# Patient Record
Sex: Male | Born: 1995 | Hispanic: Yes | Marital: Single | Smoking: Never smoker
Health system: Southern US, Community
[De-identification: ages and names within clinical notes are randomized; demographics above are authoritative.]

## PROBLEM LIST (undated history)

## (undated) DIAGNOSIS — K219 Gastro-esophageal reflux disease without esophagitis: Secondary | ICD-10-CM

## (undated) DIAGNOSIS — L509 Urticaria, unspecified: Secondary | ICD-10-CM

## (undated) DIAGNOSIS — J069 Acute upper respiratory infection, unspecified: Secondary | ICD-10-CM

## (undated) HISTORY — PX: TYMPANOSTOMY TUBE PLACEMENT: SHX32

## (undated) HISTORY — DX: Urticaria, unspecified: L50.9

## (undated) HISTORY — DX: Acute upper respiratory infection, unspecified: J06.9

## (undated) HISTORY — DX: Gastro-esophageal reflux disease without esophagitis: K21.9

---

## 2014-10-02 HISTORY — PX: HAND SURGERY: SHX662

## 2015-12-07 ENCOUNTER — Emergency Department (HOSPITAL_COMMUNITY): Payer: BLUE CROSS/BLUE SHIELD

## 2015-12-07 ENCOUNTER — Encounter (HOSPITAL_COMMUNITY): Payer: Self-pay | Admitting: *Deleted

## 2015-12-07 ENCOUNTER — Emergency Department (HOSPITAL_COMMUNITY)
Admission: EM | Admit: 2015-12-07 | Discharge: 2015-12-08 | Disposition: A | Discharge status: Home / Self Care | Payer: BLUE CROSS/BLUE SHIELD | Attending: Physician Assistant | Admitting: Physician Assistant

## 2015-12-07 DIAGNOSIS — Y92410 Unspecified street and highway as the place of occurrence of the external cause: Secondary | ICD-10-CM | POA: Diagnosis not present

## 2015-12-07 DIAGNOSIS — S92355A Nondisplaced fracture of fifth metatarsal bone, left foot, initial encounter for closed fracture: Secondary | ICD-10-CM | POA: Insufficient documentation

## 2015-12-07 DIAGNOSIS — S8992XA Unspecified injury of left lower leg, initial encounter: Secondary | ICD-10-CM | POA: Diagnosis not present

## 2015-12-07 DIAGNOSIS — R55 Syncope and collapse: Secondary | ICD-10-CM | POA: Insufficient documentation

## 2015-12-07 DIAGNOSIS — S79912A Unspecified injury of left hip, initial encounter: Secondary | ICD-10-CM | POA: Diagnosis not present

## 2015-12-07 DIAGNOSIS — S99912A Unspecified injury of left ankle, initial encounter: Secondary | ICD-10-CM | POA: Diagnosis not present

## 2015-12-07 DIAGNOSIS — Y9389 Activity, other specified: Secondary | ICD-10-CM | POA: Diagnosis not present

## 2015-12-07 DIAGNOSIS — S62397A Other fracture of fifth metacarpal bone, left hand, initial encounter for closed fracture: Secondary | ICD-10-CM | POA: Diagnosis not present

## 2015-12-07 DIAGNOSIS — Y998 Other external cause status: Secondary | ICD-10-CM | POA: Diagnosis not present

## 2015-12-07 DIAGNOSIS — S99922A Unspecified injury of left foot, initial encounter: Secondary | ICD-10-CM | POA: Diagnosis present

## 2015-12-07 LAB — BASIC METABOLIC PANEL
ANION GAP: 12 (ref 5–15)
BUN: 9 mg/dL (ref 6–20)
CHLORIDE: 100 mmol/L — AB (ref 101–111)
CO2: 28 mmol/L (ref 22–32)
Calcium: 10.1 mg/dL (ref 8.9–10.3)
Creatinine, Ser: 1 mg/dL (ref 0.61–1.24)
GFR calc non Af Amer: >60 mL/min (ref >60)
Glucose, Bld: 106 mg/dL — ABNORMAL HIGH (ref 65–99)
POTASSIUM: 4.2 mmol/L (ref 3.5–5.1)
SODIUM: 140 mmol/L (ref 135–145)

## 2015-12-07 LAB — CBC
HEMATOCRIT: 46.7 % (ref 39.0–52.0)
HEMOGLOBIN: 16.3 g/dL (ref 13.0–17.0)
MCH: 30.4 pg (ref 26.0–34.0)
MCHC: 34.9 g/dL (ref 30.0–36.0)
MCV: 87.1 fL (ref 78.0–100.0)
Platelets: 235 10*3/uL (ref 150–400)
RBC: 5.36 MIL/uL (ref 4.22–5.81)
RDW: 12.8 % (ref 11.5–15.5)
WBC: 13.7 10*3/uL — AB (ref 4.0–10.5)

## 2015-12-07 MED ORDER — HYDROMORPHONE HCL 1 MG/ML IJ SOLN
1.0000 mg | Freq: Once | INTRAMUSCULAR | Status: AC
  Administered 2015-12-07: 1 mg via INTRAVENOUS
  Filled 2015-12-07: qty 1

## 2015-12-07 MED ORDER — HYDROMORPHONE HCL 1 MG/ML IJ SOLN
0.5000 mg | Freq: Once | INTRAMUSCULAR | Status: AC
  Administered 2015-12-07: 0.5 mg via INTRAVENOUS
  Filled 2015-12-07: qty 1

## 2015-12-07 MED ORDER — OXYCODONE-ACETAMINOPHEN 5-325 MG PO TABS: 1.0000 | ORAL_TABLET | Freq: Four times a day (QID) | ORAL | Status: DC | PRN
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
## 2015-12-07 NOTE — ED Notes (Addendum)
Patient transported to CT via stretcher.
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
## 2015-12-07 NOTE — Discharge Instructions (Signed)
Mr. Alda PonderGuiliano Meloni,  Nice meeting you! Please follow-up with the hand surgeon, Dr. Mina MarbleWeingold (call them first thing tomorrow to set up an appointment), and Dr. Roda ShuttersXu for your foot fracture. Return to the emergency department if you develop confusion, lose consciousness, have chest pain, shortness of breath, lose control of your bladder/bowel. Feel better soon!  S. Lane HackerNicole Lizmarie Witters, PA-C

## 2015-12-08 ENCOUNTER — Emergency Department (HOSPITAL_COMMUNITY): Payer: BLUE CROSS/BLUE SHIELD

## 2015-12-08 DIAGNOSIS — S92355A Nondisplaced fracture of fifth metatarsal bone, left foot, initial encounter for closed fracture: Secondary | ICD-10-CM | POA: Diagnosis not present

## 2015-12-08 LAB — URINE MICROSCOPIC-ADD ON: WBC UA: NONE SEEN WBC/hpf (ref 0–5)

## 2015-12-08 LAB — URINALYSIS, ROUTINE W REFLEX MICROSCOPIC
Bilirubin Urine: NEGATIVE
GLUCOSE, UA: NEGATIVE mg/dL
KETONES UR: 15 mg/dL — AB
LEUKOCYTES UA: NEGATIVE
NITRITE: NEGATIVE
PH: 5.5 (ref 5.0–8.0)
Protein, ur: NEGATIVE mg/dL
Specific Gravity, Urine: 1.016 (ref 1.005–1.030)

## 2015-12-08 MED ORDER — IBUPROFEN 800 MG PO TABS
800.0000 mg | ORAL_TABLET | Freq: Once | ORAL | Status: AC
  Administered 2015-12-08: 800 mg via ORAL
  Filled 2015-12-08: qty 1

## 2015-12-08 MED ORDER — IOHEXOL 300 MG/ML  SOLN
100.0000 mL | Freq: Once | INTRAMUSCULAR | Status: AC | PRN
  Administered 2015-12-08: 100 mL via INTRAVENOUS

## 2015-12-08 MED ORDER — OXYCODONE-ACETAMINOPHEN 5-325 MG PO TABS
ORAL_TABLET | ORAL | Status: AC
  Filled 2015-12-08: qty 1

## 2015-12-08 MED ORDER — OXYCODONE-ACETAMINOPHEN 5-325 MG PO TABS
1.0000 | ORAL_TABLET | Freq: Once | ORAL | Status: AC
  Administered 2015-12-08: 1 via ORAL

## 2015-12-08 NOTE — Progress Notes (Signed)
Orthopedic Tech Progress Note Patient Details:  Martin Bolton 12/29/1995 [id number]  Ortho Devices Type of Ortho Device: CAM walker, Ulna gutter splint Ortho Device/Splint Location: lle cam, lue ulna Ortho Device/Splint Interventions: Ordered, Application   [name], Jazzie Trampe J 12/08/2015, 12:32 AM

## 2015-12-08 NOTE — ED Notes (Signed)
Patient transported to CT 
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
## 2015-12-22 ENCOUNTER — Telehealth: Payer: Self-pay | Admitting: Family Medicine

## 2015-12-22 ENCOUNTER — Ambulatory Visit (INDEPENDENT_AMBULATORY_CARE_PROVIDER_SITE_OTHER): Payer: BLUE CROSS/BLUE SHIELD | Admitting: Podiatry

## 2015-12-22 ENCOUNTER — Encounter: Payer: Self-pay | Admitting: Podiatry

## 2015-12-22 ENCOUNTER — Ambulatory Visit: Payer: BLUE CROSS/BLUE SHIELD | Admitting: Family Medicine

## 2015-12-22 ENCOUNTER — Ambulatory Visit (INDEPENDENT_AMBULATORY_CARE_PROVIDER_SITE_OTHER): Payer: BLUE CROSS/BLUE SHIELD

## 2015-12-22 VITALS — BP 112/64 | HR 60 | Resp 16

## 2015-12-22 DIAGNOSIS — M79605 Pain in left leg: Secondary | ICD-10-CM

## 2015-12-22 DIAGNOSIS — M79671 Pain in right foot: Secondary | ICD-10-CM

## 2015-12-22 DIAGNOSIS — S92302A Fracture of unspecified metatarsal bone(s), left foot, initial encounter for closed fracture: Secondary | ICD-10-CM

## 2015-12-22 DIAGNOSIS — M79672 Pain in left foot: Secondary | ICD-10-CM

## 2015-12-22 DIAGNOSIS — M779 Enthesopathy, unspecified: Secondary | ICD-10-CM
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
## 2015-12-23 ENCOUNTER — Telehealth: Payer: Self-pay | Admitting: *Deleted

## 2015-12-23 MED ORDER — OXYCODONE-ACETAMINOPHEN 10-325 MG PO TABS: 1.0000 | ORAL_TABLET | Freq: Three times a day (TID) | ORAL | Status: DC | PRN

## 2015-12-23 NOTE — Telephone Encounter (Signed)
Pt was no show for new pt appt 12/22/15 4:00pm, [facility], perhaps pt was not aware? Charge or no charge?
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
## 2015-12-24 NOTE — Telephone Encounter (Signed)
-----   Message from Pearline CablesJessica C Copland, MD sent at 12/24/2015  2:00 PM EDT ----- No charge this time

## 2015-12-29 ENCOUNTER — Ambulatory Visit (INDEPENDENT_AMBULATORY_CARE_PROVIDER_SITE_OTHER): Payer: BLUE CROSS/BLUE SHIELD

## 2015-12-29 ENCOUNTER — Encounter: Payer: Self-pay | Admitting: Podiatry

## 2015-12-29 ENCOUNTER — Ambulatory Visit (INDEPENDENT_AMBULATORY_CARE_PROVIDER_SITE_OTHER): Payer: BLUE CROSS/BLUE SHIELD | Admitting: Podiatry

## 2015-12-29 VITALS — BP 121/71 | HR 74 | Temp 98.8°F | Resp 16

## 2015-12-29 DIAGNOSIS — S92302A Fracture of unspecified metatarsal bone(s), left foot, initial encounter for closed fracture: Secondary | ICD-10-CM

## 2015-12-29 DIAGNOSIS — M79605 Pain in left leg: Secondary | ICD-10-CM | POA: Diagnosis not present

## 2015-12-29 NOTE — Progress Notes (Signed)
Subjective:     Patient ID: Martin Bolton, male   DOB: 11/01/1995, 20 y.o.   MRN: [id number]  HPI patient presents stating he's not been able to wear his boot and he continues to have a lot of pain in his foot but also his knee which is slated for an MRI today at 3:00   Review of Systems     Objective:   Physical Exam Neurovascular status remains intact with edema in the left forefoot and into the left lateral foot with quite a bit of pain base of fifth metatarsal left with inflammation pain in this area    Assessment:     Fracture base of metatarsal fifth left with edema and pain in the foot which is probably due to injury    Plan:     H&P and conditions reviewed with patient. At this point we reviewed x-rays with him which were taken today and I advised on continued nonweightbearing and we will wait for the results of the MRI need to decide what else may be appropriate. He seems to be in a better mental status today and is able to walk with his crutches without problems  Etc. report indicates fracture that appears to be present base of fifth metatarsal but appears to be stable

## 2016-01-19 ENCOUNTER — Encounter: Payer: BLUE CROSS/BLUE SHIELD | Admitting: Podiatry

## 2016-03-01 NOTE — Progress Notes (Signed)
This encounter was created in error - please disregard.

## 2017-11-30 HISTORY — PX: OTHER SURGICAL HISTORY: SHX169
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
## 2018-05-03 ENCOUNTER — Ambulatory Visit (INDEPENDENT_AMBULATORY_CARE_PROVIDER_SITE_OTHER): Payer: BLUE CROSS/BLUE SHIELD | Admitting: Allergy & Immunology

## 2018-05-03 ENCOUNTER — Encounter: Payer: Self-pay | Admitting: Allergy & Immunology

## 2018-05-03 VITALS — BP 116/76 | HR 72 | Temp 98.1°F | Resp 12 | Ht 67.25 in | Wt 160.6 lb

## 2018-05-03 DIAGNOSIS — J310 Chronic rhinitis: Secondary | ICD-10-CM

## 2018-05-03 MED ORDER — AZELASTINE HCL 0.1 % NA SOLN: 2.0000 | Freq: Two times a day (BID) | NASAL | 5 refills | Status: DC

## 2018-05-03 NOTE — Progress Notes (Signed)
NEW PATIENT  Date of Service/Encounter:  05/03/18  Referring provider: System, Pcp Not In   Assessment:   Chronic rhinitis - with non reactive histamine today  Plan/Recommendations:   1. Chronic rhinitis - Testing today showed: negative, likely since the cetirizine was in your system - We will see you next Friday at 1:30pm for re-testing. - Start off of your new nasal spray (azelastine) and cetirizine for THREE DAYS before your next appointment (stop Tuesday August 6th) - Continue with: Zyrtec (cetirizine) 10mg  tablet once daily and Flonase (fluticasone) two sprays per nostril daily - Start taking: Astelin (azelastine) 2 sprays per nostril 1-2 times daily as needed - You can use an extra dose of the antihistamine, if needed, for breakthrough symptoms.  - Consider nasal saline rinses 1-2 times daily to remove allergens from the nasal cavities as well as help with mucous clearance (this is especially helpful to do before the nasal sprays are given)  2. Return in about 1 week (around 05/10/2018).  Subjective:   Martin Bolton is a 22 y.o. male presenting today for evaluation of  Chief Complaint  Patient presents with  . Nasal Congestion    Martin Bolton has a history of the following: There are no active problems to display for this patient.   History obtained from: chart review and patient.  Martin Bolton was referred by System, Pcp Not In.     Martin Bolton is a 22 y.o. male presenting for nasal congestion. Symptoms started years ago when he was a kid. Currently he is taking cetirizine which does provide some relief. He does use Flonase which he has been on this for three years. He is using this throughout the year. He denies episodes of sinusitis. He does have sneezing and itchy watery eyes. Symptoms are worse in the spring.  It is not worse around any animals. He was on montelukast without improvement. He does endorse throat clearing throughout the year. He was born in  [place].   Otherwise, there is no history of other atopic diseases, including asthma, drug allergies, food allergies, stinging insect allergies, or urticaria. There is no significant infectious history. Vaccinations are up to date.    Past Medical History: There are no active problems to display for this patient.   Medication List:  Allergies as of 05/03/2018      Reactions   Fluoxetine Other (See Comments)   'felt weird headache, 'spaced out' 'felt weird headache, 'spaced out'      Medication List        Accurate as of 05/03/18  9:02 PM. Always use your most recent med list.          azelastine 0.1 % nasal spray Commonly known as:  ASTELIN Place 2 sprays into both nostrils 2 (two) times daily. Use in each nostril as directed   buPROPion 300 MG 24 hr tablet Commonly known as:  WELLBUTRIN XL TAKE 1 TABLET BY MOUTH EVERY DAY IN THE MORNING AS DIRECTED   Cetirizine HCl 10 MG Caps Take by mouth.   hydrOXYzine 25 MG capsule Commonly known as:  VISTARIL TAKE 1 TO 2 CAPSULE BY MOUTH TWICE A DAY AS NEEDED   omeprazole 40 MG capsule Commonly known as:  PRILOSEC Take by mouth.       Birth History: non-contributory.   Developmental History: non-contributory.   Past Surgical History: Past Surgical History:  Procedure Laterality Date  . gum graph  11/2017  . HAND SURGERY Left 2016     Family  History: Family History  Problem Relation Age of Onset  . Allergic rhinitis Mother   . Asthma Neg Hx   . Eczema Neg Hx   . Urticaria Neg Hx   . Immunodeficiency Neg Hx   . Angioedema Neg Hx      Social History: Martin Bolton lives at home with his mother, step father, and grandfather. He does have an older sister. 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 He does enjoy going to the gym and watches Netflix.       Review of Systems: a 14-point review of systems is pertinent for what is mentioned in HPI.  Otherwise, all other systems were  negative. Constitutional: negative other than that listed in the HPI Eyes: negative other than that listed in the HPI Ears, nose, mouth, throat, and face: negative other than that listed in the HPI Respiratory: negative other than that listed in the HPI Cardiovascular: negative other than that listed in the HPI Gastrointestinal: negative other than that listed in the HPI Genitourinary: negative other than that listed in the HPI Integument: negative other than that listed in the HPI Hematologic: negative other than that listed in the HPI Musculoskeletal: negative other than that listed in the HPI Neurological: negative other than that listed in the HPI Allergy/Immunologic: negative other than that listed in the HPI    Objective:   Blood pressure 116/76, pulse 72, temperature 98.1 F (36.7 C), temperature source Oral, resp. rate 12, height 5' 7.25" (1.708 m), weight 160 lb 9.6 oz (72.8 kg), SpO2 96 %. Body mass index is 24.97 kg/m.   Physical Exam:  General: Alert, in no acute distress. A young man of few words.  Eyes: No conjunctival injection bilaterally, no discharge on the right, no discharge on the left, no Horner-Trantas dots present and allergic shiners present bilaterally. PERRL bilaterally. EOMI without pain. No photophobia.  Ears: Right TM pearly gray with normal light reflex, Left TM pearly gray with normal light reflex, Right TM intact without perforation and Left TM intact without perforation.  Nose/Throat: External nose within normal limits, nasal crease present and septum midline. Turbinates markedly edematous and pale with clear discharge. Posterior oropharynx erythematous with cobblestoning in the posterior oropharynx. Tonsils 2+ without exudates.  Tongue without thrush. Neck: Supple without thyromegaly. Trachea midline. Adenopathy: no enlarged lymph nodes appreciated in the anterior cervical, occipital, axillary, epitrochlear, inguinal, or popliteal  regions. Lungs: Clear to auscultation without wheezing, rhonchi or rales. No increased work of breathing. CV: Normal S1/S2. No murmurs. Capillary refill <2 seconds.  Abdomen: Nondistended, nontender. No guarding or rebound tenderness. Bowel sounds present in all fields and hypoactive  Skin: Warm and dry, without lesions or rashes. Extremities:  No clubbing, cyanosis or edema. Neuro:   Grossly intact. No focal deficits appreciated. Responsive to questions.  Diagnostic studies:   Allergy Studies:   Indoor/Outdoor Percutaneous Adult Environmental Panel:negative to the entire panel, however difficult to interpret since the histamine was non-reactive.    Allergy testing results were read and interpreted by myself, documented by clinical staff.       Malachi Bonds, MD [facility]

## 2018-05-03 NOTE — Patient Instructions (Addendum)
1. Chronic rhinitis - Testing today showed: negative, likely since the cetirizine was in your system - We will see you next Friday at 1:30pm for re-testing. - Start off of your new nasal spray (azelastine) and cetirizine for THREE DAYS before your next appointment (stop Tuesday August 6th) - Continue with: Zyrtec (cetirizine) 10mg  tablet once daily and Flonase (fluticasone) two sprays per nostril daily - Start taking: Astelin (azelastine) 2 sprays per nostril 1-2 times daily as needed - You can use an extra dose of the antihistamine, if needed, for breakthrough symptoms.  - Consider nasal saline rinses 1-2 times daily to remove allergens from the nasal cavities as well as help with mucous clearance (this is especially helpful to do before the nasal sprays are given)  2. Return in about 1 week (around 05/10/2018).   Please inform us of any Emergency Department visits, hospitalizations, or changes in symptoms. Call us before going to the ED for breathing or allergy symptoms since we might be able to fit you in for a sick visit. Feel free to contact us anytime with any questions, problems, or concerns.  It was a pleasure to meet you today!  Websites that have reliable patient information: 1. American Academy of Asthma, Allergy, and Immunology: www.aaaai.org 2. Food Allergy Research and Education (FARE): foodallergy.org 3. Mothers of Asthmatics: http://www.asthmacommunitynetwork.org 4. American College of Allergy, Asthma, and Immunology: MissingWeapons.cawww.acaai.org   Make sure you are registered to vote! If you have moved or changed any of your contact information, you will need to get this updated before voting!

## 2018-05-07 ENCOUNTER — Encounter: Payer: Self-pay | Admitting: Allergy & Immunology

## 2018-05-10 ENCOUNTER — Encounter: Payer: Self-pay | Admitting: Allergy & Immunology

## 2018-05-10 ENCOUNTER — Ambulatory Visit: Payer: BLUE CROSS/BLUE SHIELD | Admitting: Allergy & Immunology

## 2018-05-10 VITALS — BP 110/70 | HR 66 | Temp 97.8°F | Resp 18 | Ht 67.0 in | Wt 159.4 lb

## 2018-05-10 DIAGNOSIS — J3089 Other allergic rhinitis: Secondary | ICD-10-CM | POA: Diagnosis not present

## 2018-05-10 MED ORDER — ODACTRA 12 SQ-HDM SL SUBL: 1.0000 | SUBLINGUAL_TABLET | Freq: Every day | SUBLINGUAL | 11 refills | Status: AC

## 2018-05-10 NOTE — Patient Instructions (Addendum)
1. Chronic rhinitis - Testing today showed: indoor molds and dust mites  (dust mites were definitely the most reactive) - Avoidance measures provided. - Continue with: Zyrtec (cetirizine) 10mg  tablet once daily, Flonase (fluticasone) two sprays per nostril daily and Astelin (azelastine) 2 sprays per nostril 1-2 times daily as needed - Start taking: Isaiah Serge one oral dissolving tablet daily (under tongue for 30 seconds) - You can use an extra dose of the antihistamine, if needed, for breakthrough symptoms.  - Consider nasal saline rinses 1-2 times daily to remove allergens from the nasal cavities as well as help with mucous clearance (this is especially helpful to do before the nasal sprays are given)  2. Return in about 6 months (around 11/10/2018).   Please inform us of any Emergency Department visits, hospitalizations, or changes in symptoms. Call us before going to the ED for breathing or allergy symptoms since we might be able to fit you in for a sick visit. Feel free to contact us anytime with any questions, problems, or concerns.  It was a pleasure to see you again today!  Websites that have reliable patient information: 1. American Academy of Asthma, Allergy, and Immunology: www.aaaai.org 2. Food Allergy Research and Education (FARE): foodallergy.org 3. Mothers of Asthmatics: http://www.asthmacommunitynetwork.org 4. American College of Allergy, Asthma, and Immunology: MissingWeapons.ca   Make sure you are registered to vote! If you have moved or changed any of your contact information, you will need to get this updated before voting!       Control of Mold Allergen   Mold and fungi can grow on a variety of surfaces provided certain temperature and moisture conditions exist.  Outdoor molds grow on plants, decaying vegetation and soil.  The major outdoor mold, Alternaria and Cladosporium, are found in very high numbers during hot and dry conditions.  Generally, a late Summer - Fall peak  is seen for common outdoor fungal spores.  Rain will temporarily lower outdoor mold spore count, but counts rise rapidly when the rainy period ends.  The most important indoor molds are Aspergillus and Penicillium.  Dark, humid and poorly ventilated basements are ideal sites for mold growth.  The next most common sites of mold growth are the bathroom and the kitchen.  Indoor (Perennial) Mold Control   Positive indoor molds via skin testing: Fusarium, Aureobasidium (Pullulara) and Rhizopus  1. Maintain humidity below 50%. 2. Clean washable surfaces with 5% bleach solution. 3. Remove sources e.g. contaminated carpets.     Control of House Dust Mite Allergen    House dust mites play a major role in allergic asthma and rhinitis.  They occur in environments with high humidity wherever human skin, the food for dust mites is found. High levels have been detected in dust obtained from mattresses, pillows, carpets, upholstered furniture, bed covers, clothes and soft toys.  The principal allergen of the house dust mite is found in its feces.  A gram of dust may contain 1,000 mites and 250,000 fecal particles.  Mite antigen is easily measured in the air during house cleaning activities.    1. Encase mattresses, including the box spring, and pillow, in an air tight cover.  Seal the zipper end of the encased mattresses with wide adhesive tape. 2. Wash the bedding in water of 130 degrees Farenheit weekly.  Avoid cotton comforters/quilts and flannel bedding: the most ideal bed covering is the dacron comforter. 3. Remove all upholstered furniture from the bedroom. 4. Remove carpets, carpet padding, rugs, and non-washable window drapes  from the bedroom.  Wash drapes weekly or use plastic window coverings. 5. Remove all non-washable stuffed toys from the bedroom.  Wash stuffed toys weekly. 6. Have the room cleaned frequently with a vacuum cleaner and a damp dust-mop.  The patient should not be in a room which  is being cleaned and should wait 1 hour after cleaning before going into the room. 7. Close and seal all heating outlets in the bedroom.  Otherwise, the room will become filled with dust-laden air.  An electric heater can be used to heat the room. 8. Reduce indoor humidity to less than 50%.  Do not use a humidifier.

## 2018-05-10 NOTE — Progress Notes (Signed)
FOLLOW UP  Date of Service/Encounter:  05/10/18   Assessment:   Perennial allergic rhinitis (indoor molds, dust mites)   Plan/Recommendations:   1. Chronic rhinitis - Testing today showed: indoor molds and dust mites (dust mites were definitely the most reactive) - Avoidance measures provided. - Continue with: Zyrtec (cetirizine) 10mg  tablet once daily, Flonase (fluticasone) two sprays per nostril daily and Astelin (azelastine) 2 sprays per nostril 1-2 times daily as needed - Start taking: [name] one oral dissolving tablet daily (under tongue for 30 seconds) - You can use an extra dose of the antihistamine, if needed, for breakthrough symptoms.  - Consider nasal saline rinses 1-2 times daily to remove allergens from the nasal cavities as well as help with mucous clearance (this is especially helpful to do before the nasal sprays are given)  2. Return in about 6 months (around 11/10/2018).  Subjective:   Martin Bolton is a 22 y.o. male presenting today for follow up of  Chief Complaint  Patient presents with  . Allergy Testing    Environmentals    Martin Bolton has a history of the following: Patient Active Problem List   Diagnosis Date Noted  . Perennial allergic rhinitis 05/10/2018    History obtained from: chart review and patient.  Martin Bolton's Primary Care Provider is System, Pcp Not In.     Martin Bolton is a 22 y.o. male presenting for a skin testing.  He was last seen 1 week ago.  At that time, he was complaining of chronic rhinitis symptoms.  Unfortunately, we found that he had taken an antihistamine after we had already skin tested.  Therefore, we brought him back in for repeat skin testing today.  We did start him on as a lasting 2 sprays per nostril 1-2 times daily and continue his Zyrtec and Flonase.  Since the last visit, he has done well. He did not start any of the next medications at this time.   Otherwise, there have been no changes to his past  medical history, surgical history, family history, or social history.    Review of Systems: a 14-point review of systems is pertinent for what is mentioned in HPI.  Otherwise, all other systems were negative. Constitutional: negative other than that listed in the HPI Eyes: negative other than that listed in the HPI Ears, nose, mouth, throat, and face: negative other than that listed in the HPI Respiratory: negative other than that listed in the HPI Cardiovascular: negative other than that listed in the HPI Gastrointestinal: negative other than that listed in the HPI Genitourinary: negative other than that listed in the HPI Integument: negative other than that listed in the HPI Hematologic: negative other than that listed in the HPI Musculoskeletal: negative other than that listed in the HPI Neurological: negative other than that listed in the HPI Allergy/Immunologic: negative other than that listed in the HPI    Objective:   Blood pressure 110/70, pulse 66, temperature 97.8 F (36.6 C), temperature source Oral, resp. rate 18, height 5\' 7"  (1.702 m), weight 159 lb 6.4 oz (72.3 kg), SpO2 96 %. Body mass index is 24.97 kg/m.   Physical Exam: deferred since this was a skin testing appointment only  Diagnostic studies:   Allergy Studies:   Indoor/Outdoor Percutaneous Adult Environmental Panel: negative with adequate controls.  Indoor/Outdoor Selected Intradermal Environmental Panel: positive to mold mix #4 and mite mix. Otherwise negative with adequate controls.    Allergy testing results were read and interpreted by myself, documented  by clinical staff.      Martin Bonds, MD  [facility]

## 2018-06-12 ENCOUNTER — Telehealth: Payer: Self-pay

## 2018-06-12 NOTE — Telephone Encounter (Signed)
I attempted the prior authorization via covermymeds. I spoke with mom to advise that the authorization for eligibility was showing patient as not covered. I did force a submission on the website and the insurance company will notify me of coverage determination in 3-4 business days.

## 2018-06-12 NOTE — Telephone Encounter (Signed)
[employer] representative called Judeth Cornfield) to request some additional information regarding prior authorization. I gave all clinically relevant information and will receive a fax authorization soon.

## 2018-06-12 NOTE — Telephone Encounter (Signed)
Noted. Thanks for doing all of that legwork!   Malachi Bonds, MD [facility]

## 2018-06-14 NOTE — Telephone Encounter (Signed)
Fax for medication approval has [facility] us [facility] [employer] [facility]. It has been scanned into the patient chart.

## 2018-06-26 NOTE — Telephone Encounter (Signed)
Awesome. Thanks for keeping me in loop.  Malachi Bonds, MD [facility]

## 2018-11-15 ENCOUNTER — Ambulatory Visit: Payer: BLUE CROSS/BLUE SHIELD | Admitting: Allergy & Immunology

## 2018-11-15 ENCOUNTER — Ambulatory Visit: Payer: BLUE CROSS/BLUE SHIELD | Admitting: Allergy

## 2018-11-15 DIAGNOSIS — J301 Allergic rhinitis due to pollen: Secondary | ICD-10-CM

## 2019-12-21 ENCOUNTER — Ambulatory Visit: Payer: BLUE CROSS/BLUE SHIELD | Attending: Internal Medicine

## 2019-12-21 DIAGNOSIS — Z23 Encounter for immunization: Secondary | ICD-10-CM

## 2019-12-21 NOTE — Progress Notes (Signed)
   Covid-19 Vaccination Clinic  Name:  Demetruis Depaul    MRN: [id number] DOB: November 20, 1995  12/21/2019  Mr. Lovern was observed post Covid-19 immunization for 15 minutes without incident. He was provided with Vaccine Information Sheet and instruction to access the V-Safe system.   Mr. Casebeer was instructed to call 911 with any severe reactions post vaccine: [name] Kitchen Difficulty breathing  . Swelling of face and throat  . A fast heartbeat  . A bad rash all over body  . Dizziness and weakness   Immunizations Administered    Name Date Dose VIS Date Route   Pfizer COVID-19 Vaccine 12/21/2019  5:45 PM 0.3 mL 09/12/2019 Intramuscular   Manufacturer: [employer], [employer]   Lot: [id number]   NDC: [address]-0

## 2020-01-11 ENCOUNTER — Other Ambulatory Visit: Payer: Self-pay

## 2020-01-11 ENCOUNTER — Ambulatory Visit: Payer: BLUE CROSS/BLUE SHIELD | Attending: Internal Medicine

## 2020-01-11 DIAGNOSIS — Z23 Encounter for immunization: Secondary | ICD-10-CM

## 2020-01-11 NOTE — Progress Notes (Signed)
   Covid-19 Vaccination Clinic  Name:  Martin Bolton    MRN: [id number] DOB: [birth year]  01/11/2020  [name] was observed post Covid-19 immunization for 15 minutes without incident. He was provided with Vaccine Information Sheet and instruction to access the V-Safe system.   Mr. College was instructed to call 911 with any severe reactions post vaccine: [name] Kitchen Difficulty breathing  . Swelling of face and throat  . A fast heartbeat  . A bad rash all over body  . Dizziness and weakness   Immunizations Administered    Name Date Dose VIS Date Route   Pfizer COVID-19 Vaccine 01/11/2020  5:21 PM 0.3 mL 09/12/2019 Intramuscular   Manufacturer: [employer], [employer]   Lot: [id number]   NDC: [address]-3

## 2020-03-26 ENCOUNTER — Emergency Department (HOSPITAL_COMMUNITY)
Admission: EM | Admit: 2020-03-26 | Discharge: 2020-03-27 | Disposition: A | Discharge status: Home / Self Care | Payer: BC Managed Care – PPO | Attending: Emergency Medicine | Admitting: Emergency Medicine

## 2020-03-26 ENCOUNTER — Other Ambulatory Visit: Payer: Self-pay

## 2020-03-26 DIAGNOSIS — R451 Restlessness and agitation: Secondary | ICD-10-CM

## 2020-03-26 DIAGNOSIS — F1994 Other psychoactive substance use, unspecified with psychoactive substance-induced mood disorder: Secondary | ICD-10-CM | POA: Diagnosis not present

## 2020-03-26 DIAGNOSIS — R45851 Suicidal ideations: Secondary | ICD-10-CM

## 2020-03-26 DIAGNOSIS — Z79899 Other long term (current) drug therapy: Secondary | ICD-10-CM | POA: Insufficient documentation

## 2020-03-26 DIAGNOSIS — F909 Attention-deficit hyperactivity disorder, unspecified type: Secondary | ICD-10-CM | POA: Diagnosis not present

## 2020-03-26 DIAGNOSIS — F19951 Other psychoactive substance use, unspecified with psychoactive substance-induced psychotic disorder with hallucinations: Secondary | ICD-10-CM | POA: Insufficient documentation

## 2020-03-26 DIAGNOSIS — T40994A Poisoning by other psychodysleptics [hallucinogens], undetermined, initial encounter: Secondary | ICD-10-CM | POA: Diagnosis not present

## 2020-03-26 DIAGNOSIS — F1610 Hallucinogen abuse, uncomplicated: Secondary | ICD-10-CM | POA: Diagnosis present

## 2020-03-26 DIAGNOSIS — F1910 Other psychoactive substance abuse, uncomplicated: Secondary | ICD-10-CM

## 2020-03-26 DIAGNOSIS — Z20822 Contact with and (suspected) exposure to covid-19: Secondary | ICD-10-CM | POA: Insufficient documentation

## 2020-03-26 DIAGNOSIS — F23 Brief psychotic disorder: Secondary | ICD-10-CM | POA: Diagnosis present

## 2020-03-26 LAB — RAPID URINE DRUG SCREEN, HOSP PERFORMED
Amphetamines: NOT DETECTED
Barbiturates: NOT DETECTED
Benzodiazepines: NOT DETECTED
Cocaine: NOT DETECTED
Opiates: NOT DETECTED
Tetrahydrocannabinol: POSITIVE — AB

## 2020-03-26 LAB — CBC WITH DIFFERENTIAL/PLATELET
Abs Immature Granulocytes: 0.08 10*3/uL — ABNORMAL HIGH (ref 0.00–0.07)
Basophils Absolute: 0.1 10*3/uL (ref 0.0–0.1)
Basophils Relative: 1 %
Eosinophils Absolute: 0.1 10*3/uL (ref 0.0–0.5)
Eosinophils Relative: 0 %
HCT: 46.5 % (ref 39.0–52.0)
Hemoglobin: 16 g/dL (ref 13.0–17.0)
Immature Granulocytes: 1 %
Lymphocytes Relative: 12 %
Lymphs Abs: 1.5 10*3/uL (ref 0.7–4.0)
MCH: 30.5 pg (ref 26.0–34.0)
MCHC: 34.4 g/dL (ref 30.0–36.0)
MCV: 88.6 fL (ref 80.0–100.0)
Monocytes Absolute: 0.7 10*3/uL (ref 0.1–1.0)
Monocytes Relative: 5 %
Neutro Abs: 10.6 10*3/uL — ABNORMAL HIGH (ref 1.7–7.7)
Neutrophils Relative %: 81 %
Platelets: 305 10*3/uL (ref 150–400)
RBC: 5.25 MIL/uL (ref 4.22–5.81)
RDW: 12.5 % (ref 11.5–15.5)
WBC: 12.9 10*3/uL — ABNORMAL HIGH (ref 4.0–10.5)
nRBC: 0 % (ref 0.0–0.2)

## 2020-03-26 LAB — COMPREHENSIVE METABOLIC PANEL
ALT: 16 U/L (ref 0–44)
AST: 18 U/L (ref 15–41)
Albumin: 5.5 g/dL — ABNORMAL HIGH (ref 3.5–5.0)
Alkaline Phosphatase: 52 U/L (ref 38–126)
Anion gap: 13 (ref 5–15)
BUN: 12 mg/dL (ref 6–20)
CO2: 25 mmol/L (ref 22–32)
Calcium: 9.6 mg/dL (ref 8.9–10.3)
Chloride: 102 mmol/L (ref 98–111)
Creatinine, Ser: 1.2 mg/dL (ref 0.61–1.24)
GFR calc Af Amer: >60 mL/min (ref >60)
GFR calc non Af Amer: >60 mL/min (ref >60)
Glucose, Bld: 116 mg/dL — ABNORMAL HIGH (ref 70–99)
Potassium: 3.6 mmol/L (ref 3.5–5.1)
Sodium: 140 mmol/L (ref 135–145)
Total Bilirubin: 0.7 mg/dL (ref 0.3–1.2)
Total Protein: 8.1 g/dL (ref 6.5–8.1)

## 2020-03-26 LAB — ETHANOL: Alcohol, Ethyl (B): <10 mg/dL (ref <10)

## 2020-03-26 LAB — SARS CORONAVIRUS 2 BY RT PCR (HOSPITAL ORDER, PERFORMED IN ~~LOC~~ HOSPITAL LAB): SARS Coronavirus 2: NEGATIVE

## 2020-03-26 MED ORDER — ZIPRASIDONE MESYLATE 20 MG IM SOLR: 20.0000 mg | INTRAMUSCULAR | Status: DC | PRN

## 2020-03-26 MED ORDER — LORAZEPAM 1 MG PO TABS
1.0000 mg | ORAL_TABLET | ORAL | Status: AC | PRN
  Administered 2020-03-26: 1 mg via ORAL
  Filled 2020-03-26: qty 1

## 2020-03-26 MED ORDER — RISPERIDONE 1 MG PO TBDP
2.0000 mg | ORAL_TABLET | Freq: Three times a day (TID) | ORAL | Status: DC | PRN
  Filled 2020-03-26: qty 2

## 2020-03-26 MED ORDER — HALOPERIDOL LACTATE 5 MG/ML IJ SOLN
5.0000 mg | Freq: Once | INTRAMUSCULAR | Status: AC
  Administered 2020-03-26: 5 mg via INTRAVENOUS
  Filled 2020-03-26: qty 1

## 2020-03-26 NOTE — BH Assessment (Addendum)
Comprehensive Clinical Assessment (CCA) Screening, Triage and Referral Note  Per ED notes, [place] in a erratic and bizarre manner stating "I want to die" after using "shrooms" today. ED notes, further reported that he was not wearing a shirt and laying on the stairs of the airport and acting agitated. When EDP asked him his name he states his name is "Martin Bolton me!" and continues to ask the police officers to shoot him. "I want to die."    Patient reports during today's assessment that he consumed "Shrooms" and this is the reason he was behaving oddly. Patient very tearful and remorseful about using drugs. He is upset about not seeing his 68 month old son. States that he really wants to go home and misses his child.    Patient denies current SI. No history suicidal attempts and gestures. No self mutilating behaviors. His biggest stressor is lack of employment and being away from his 62 month old child. Patient's denies a family history of mental illness. Patient denies HI. He does admit to a history of anxiety and episodes. He is currently seeking outpatient med management at [facility]. Patient prescribed medications 2-3 yrs ago after he was diagnosed with depression. Patient prescribed Bupropion and Lexapro. No AVH's and she does not appear to be responding to internal stimuli.   Patient with history of mushroom in the past month and believes this is the reason for a "bad trip". He has used mushrooms a total of 3 times in the past month. Last use was yesterday.  No alcohol use reported.  Patient with no significant past medical/psychiatrist history presents the emergency department under involuntary commitment.  Patient is interested in outpatient therapy.   Patient was oriented to time, person, place, and situation. Mood is depressed. He is tearful.  Insight and judgement are fair. Impulse control is fair. Patient is dressed in scrubs.    03/26/2020 Martin Bolton [id number]  Visit Diagnosis: No diagnosis found. 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 No data recorded  Referral name: [employer]   Referral phone number: No data recorded Whom do you see for routine medical problems? No data recorded  Practice/Facility Name: No data recorded  Practice/Facility Phone Number: No data recorded  Name of Contact: No data recorded  Contact Number: No data recorded  Contact Fax Number: No data recorded  Prescriber Name: No data recorded  Prescriber Address (if known): No data recorded What Is the Reason for Your Visit/Call Today? "I was flipping out over mushrooms"  How Long Has This Been Causing You Problems? > than 6 months ("I have a psychiatrist to treat my depression")  Have You Recently Been in Any Inpatient Treatment (Hospital/Detox/Crisis Center/28-Day Program)? No   Name/Location of Program/Hospital:No data recorded  How Long Were You There? No data recorded  When Were You Discharged? No data recorded Have You Ever Received Services From [facility] Before? No   Who Do You See at [facility]? No data recorded Have You Recently Had Any Thoughts About Hurting Yourself? No   Are You Planning to Commit Suicide/Harm Yourself At This time?  No  Have you Recently Had Thoughts About Hurting Someone Martin Bolton? No   Explanation: No data recorded Have You Used Any Alcohol or Drugs in the Past 24 Hours? No   How Long Ago Did You Use Drugs or Alcohol?  No data recorded  What Did You  Use and How Much? No data recorded What Do You Feel Would Help You the Most Today? Therapy  Do You Currently Have a Therapist/Psychiatrist? No   Name of Therapist/Psychiatrist: No data recorded  Have You Been Recently Discharged From Any Office Practice or Programs? No   Explanation of Discharge From Practice/Program:  No data recorded    CCA Screening Triage Referral Assessment Type  of Contact: Tele-Assessment   Is this Initial or Reassessment? Initial Assessment   Date Telepsych consult ordered in [facility]:  03/26/20   Time Telepsych consult ordered in [facility]:  No data recorded Patient Reported Information Reviewed? Yes   Patient Left Without Being Seen? No data recorded  Reason for Not Completing Assessment: No data recorded Collateral Involvement: Remer Macho  Does Patient Have a [place]? No data recorded  Name and Contact of Legal Guardian:  No data recorded If Minor and Not Living with Parent(s), Who has Custody? No data recorded Is CPS involved or ever been involved? Never  Is APS involved or ever been involved? Never  Patient Determined To Be At Risk for Harm To Self or Others Based on Review of Patient Reported Information or Presenting Complaint? No   Method: No data recorded  Availability of Means: No data recorded  Intent: No data recorded  Notification Required: No data recorded  Additional Information for Danger to Others Potential:  No data recorded  Additional Comments for Danger to Others Potential:  No data recorded  Are There Guns or Other Weapons in Your Home?  No data recorded   Types of Guns/Weapons: No data recorded   Are These Weapons Safely Secured?                              No data recorded   Who Could Verify You Are Able To Have These Secured:    No data recorded Do You Have any Outstanding Charges, Pending Court Dates, Parole/Probation? No data recorded Contacted To Inform of Risk of Harm To Self or Others: No data recorded Location of Assessment: [facility]  Does Patient Present under Involuntary Commitment? Yes   IVC Papers Initial File Date: 03/26/20   [place] of Residence: [name]  Patient Currently Receiving the Following Services: Medication Management   Determination of Need: Emergent (2 hours)   Options For Referral: Medication Management    Per Elmarie Shiley, NP, patient to  remain in the ED for overnight observation. Pending am psych evaluation.   Waldon Merl, Counselor

## 2020-03-26 NOTE — ED Notes (Signed)
Delay on vital due to PT behavior

## 2020-03-26 NOTE — ED Notes (Signed)
Pts mother came out to desk stating that the pt needed to void. This RN went into assisted pt with urinal as pt is in restraints. Pt states he is unable to void at this time.

## 2020-03-26 NOTE — ED Triage Notes (Signed)
Pt [place] via [place]. Pt arrives in cuffs at this time. Pt screaming about wanting to die. Pt is uncooperative with police and staff. Pt yelling "just shoot me"

## 2020-03-26 NOTE — ED Notes (Signed)
Pt wanded by security and walked to TCU without incidence. Pt is calm, cooperative and pleasent at this time.

## 2020-03-26 NOTE — ED Notes (Signed)
Pt remains calm and cooperative. Pt denies any SI or HI at this time. PTt reports he took "shrooms" which made him "say those things and act that way"

## 2020-03-26 NOTE — ED Notes (Signed)
Mother at bedside at this time. 

## 2020-03-26 NOTE — BH Assessment (Signed)
:   Per Fransisca Kaufmann, NP, patient to be observed overnight. Pending am psych evaluation.

## 2020-03-26 NOTE — ED Notes (Signed)
Pt calm and cooperative at this time. Pt agrees to remain calm. Wrist restraints removed at this time. Pt resting in bed. Pt states "I was on shrooms. That's why I was acting that way when I came in"  Awaiting Sitter. Charge and [facility] made aware sitter needed.

## 2020-03-26 NOTE — ED Provider Notes (Signed)
[facility]-EMERGENCY DEPT Provider Note   CSN: [id number] Arrival date & time: 03/26/20  7035     History Chief Complaint  Patient presents with  . Psychiatric Evaluation    Martin Bolton is a 24 y.o. male.  HPI      24yo male with no known significant history presents with [place] police for erratic behavior and stating "I want to die" after using "shrooms" today.  He was not wearing a shirt and laying on the stairs of the airport and acting agitated. When I ask him his name he states his name is "Martin Harper me!" and continues to ask the police officers to shoot him. "I want to die."  He says he took shrooms. Denies history of mental health problems. Denies acute medical concerns.  He is answering questions but agitated stating "just kill me" and yelling at police officers.    Past Medical History:  Diagnosis Date  . Acid reflux   . Recurrent upper respiratory infection (URI)   . Urticaria     Patient Active Problem List   Diagnosis Date Noted  . Perennial allergic rhinitis 05/10/2018    Past Surgical History:  Procedure Laterality Date  . gum graph  11/2017  . HAND SURGERY Left 2016  . TYMPANOSTOMY TUBE PLACEMENT         Family History  Problem Relation Age of Onset  . Allergic rhinitis Mother   . Asthma Sister   . Eczema Neg Hx   . Urticaria Neg Hx   . Immunodeficiency Neg Hx   . Angioedema Neg Hx     Social History   Tobacco Use  . Smoking status: Never Smoker  . Smokeless tobacco: Never Used  Vaping Use  . Vaping Use: Never used  Substance Use Topics  . Alcohol use: No  . Drug use: No    Home Medications Prior to Admission medications   Medication Sig Start Date End Date Taking? Authorizing Provider  anastrozole (ARIMIDEX) 1 MG tablet Take 1 mg by mouth daily. 03/25/20  Yes [provider]  BORON PO Take 1 tablet by mouth daily.   Yes [provider]  buPROPion (WELLBUTRIN XL) 300 MG 24 hr tablet TAKE 1 TABLET  BY MOUTH EVERY DAY IN THE MORNING AS DIRECTED 04/11/18  Yes [provider]  clomiPHENE (CLOMID) 50 MG tablet Take 50 mg by mouth daily. 03/25/20  Yes [provider]  escitalopram (LEXAPRO) 5 MG tablet Take 5 mg by mouth daily.  08/22/18  Yes [provider]  magnesium 30 MG tablet Take 30 mg by mouth daily.   Yes [provider]  Multiple Vitamin (MULTIVITAMIN WITH MINERALS) TABS tablet Take 1 tablet by mouth daily.   Yes [provider]  OVER THE COUNTER MEDICATION Take 1 tablet by mouth daily. KRE-Alkalyn   Yes [provider]  VYVANSE 40 MG capsule Take 40 mg by mouth at bedtime. 03/19/20  Yes [provider]  zinc gluconate 50 MG tablet Take 50 mg by mouth daily.   Yes [provider]  azelastine (ASTELIN) 0.1 % nasal spray Place 2 sprays into both nostrils 2 (two) times daily. Use in each nostril as directed Patient not taking: Reported on 03/26/2020 05/03/18 06/02/18  Alfonse Spruce, MD    Allergies    Fluoxetine  Review of Systems   Review of Systems  Unable to perform ROS: Mental status change  Constitutional: Negative for fever.  Respiratory: Negative for cough.   Gastrointestinal:  Negative for nausea and vomiting.    Physical Exam Updated Vital Signs BP 121/74 (BP Location: Right Arm)   Pulse 66   Temp 98.3 F (36.8 C) (Oral)   Resp 16   SpO2 99%   Physical Exam Vitals and nursing note reviewed.  Constitutional:      General: He is not in acute distress.    Appearance: Normal appearance. He is not ill-appearing, toxic-appearing or diaphoretic.  HENT:     Head: Normocephalic.  Eyes:     Conjunctiva/sclera: Conjunctivae normal.  Cardiovascular:     Rate and Rhythm: Normal rate and regular rhythm.     Pulses: Normal pulses.  Pulmonary:     Effort: Pulmonary effort is normal. No respiratory distress.  Musculoskeletal:        General: No deformity or signs of injury.     Cervical back: No  rigidity.  Skin:    General: Skin is warm and dry.     Coloration: Skin is not jaundiced or pale.  Neurological:     General: No focal deficit present.     Mental Status: He is alert and oriented to person, place, and time.  Psychiatric:        Mood and Affect: Affect is angry.        Behavior: Behavior is agitated.        Thought Content: Thought content includes suicidal ideation.     ED Results / Procedures / Treatments   Labs (all labs ordered are listed, but only abnormal results are displayed) Labs Reviewed  COMPREHENSIVE METABOLIC PANEL - Abnormal; Notable for the following components:      Result Value   Glucose, Bld 116 (*)    Albumin 5.5 (*)    All other components within normal limits  RAPID URINE DRUG SCREEN, HOSP PERFORMED - Abnormal; Notable for the following components:   Tetrahydrocannabinol POSITIVE (*)    All other components within normal limits  CBC WITH DIFFERENTIAL/PLATELET - Abnormal; Notable for the following components:   WBC 12.9 (*)    Neutro Abs 10.6 (*)    Abs Immature Granulocytes 0.08 (*)    All other components within normal limits  SARS CORONAVIRUS 2 BY RT PCR [facility] ORDER, PERFORMED IN [facility])  ETHANOL    EKG EKG Interpretation  Date/Time:  Friday March 26 2020 07:54:13 EDT Ventricular Rate:  83 PR Interval:    QRS Duration: 96 QT Interval:  354 QTC Calculation: 416 R Axis:   81 Text Interpretation: Sinus rhythm Probable inferior infarct, old No previous ECGs available Confirmed by Alvira Monday ([id number]) on 03/26/2020 9:14:16 AM   Radiology No results found.  Procedures Procedures (including critical care time)  Medications Ordered in ED Medications  risperiDONE (RISPERDAL M-TABS) disintegrating tablet 2 mg (has no administration in time range)    And  LORazepam (ATIVAN) tablet 1 mg (has no administration in time range)    And  ziprasidone (GEODON) injection 20 mg (has no administration in time range)    haloperidol lactate (HALDOL) injection 5 mg (5 mg Intravenous Given 03/26/20 0747)    ED Course  I have reviewed the triage vital signs and the nursing notes.  Pertinent labs & imaging results that were available during my care of the patient were reviewed by me and considered in my medical decision making (see chart for details).    MDM Rules/Calculators/A&P  24yo male with no known significant history [place] with [place] for erratic behavior and stating "I want to die" after using "shrooms" today.  He is severely agitated on arrival to the ED, repeatedly yelling "Kill me!"  He is hemodynamically stable, labs WNL.  He is medically cleared.  He has made suicidal statements and IVC placed given his agitation, however it may be that statements are related to substance abuse.  TTS order placed. Pt awaiting assessment at this time.     Final Clinical Impression(s) / ED Diagnoses Final diagnoses:  Substance abuse ([place])  Suicidal ideation  Agitation    Rx / DC Orders ED Discharge Orders    None       Gareth Morgan, MD 03/26/20 [phone]

## 2020-03-26 NOTE — ED Notes (Signed)
Sitter at bedside at this time 

## 2020-03-26 NOTE — ED Notes (Addendum)
Security came to me asking if pt was supposed to be out of restraints upon going into pt room pts restraints had been removed. Mother remained at bedside. When asked how he got out of restraints, both pt and family just looked at staff. Pt is more calm at this time. With security at bedside, pt was assisted to stand and void. Pt informed that at the time, he would need to go back into restraints until is behavior is more cooperative and less aggressive. Pt placed back in arm restraints, ankle restraints removed at this time due to remaining cooperative. Will assess the ability to discontinue again. Pt verbalizes understanding of discontinuing criteria. Pt repositioned in bed. Pt provided with call bell. Additionally, security stated that the pts mother was recording the pt and taking pictures of the pt. Pts mother informed that she cannot record or take videos. Security asked the pts mother to delete the video and pictures. Pts mother complied. Pt and mother made aware that the [place].

## 2020-03-27 DIAGNOSIS — F1994 Other psychoactive substance use, unspecified with psychoactive substance-induced mood disorder: Secondary | ICD-10-CM | POA: Diagnosis present

## 2020-03-27 DIAGNOSIS — F1610 Hallucinogen abuse, uncomplicated: Secondary | ICD-10-CM | POA: Diagnosis present

## 2020-03-27 NOTE — Consult Note (Signed)
[facility] ED Discharge  03/27/2020 11:04 AM Martin Bolton  MRN:  [id number] Principal Problem: Substance induced mood disorder [facility]) Discharge Diagnoses: Principal Problem:   Substance induced mood disorder (HCC) Active Problems:   Psilocybin abuse (HCC)  Subjective: "I am a lot better now."   Patient seen and evaluated in person by this provider.  He is calm and cooperative with no agitation or psychosis, clear and coherent.  He reports he was using "shrooms" yesterday and had a bad trip he has noticed the a couple other times and promises this will be his last.  He was not happy with the fact that he was not able to leave the emergency department and being with his 30-month-old son and wife.  Denies suicidal/homicidal ideations, hallucinations, mania, paranoia, and other concerning psychiatric issues.  His wife, Martin Bolton, was contacted with his permission to inquire about safety concerns.  She had no safety concerns about him returning home and agreeable with the plan for him to follow-up with alcohol drug services in [place] for assistance with substance abuse.  She also had no concerns for her or their baby safety.  Psychiatrically cleared for discharge.  HPI on admission:  Per ED notes, [place] in a erratic and bizarre manner stating "I want to die" after using "shrooms" today. ED notes, further reported that he was not wearing a shirt and laying on the stairs of the airport and acting agitated. When EDP asked him his name he states his name is "Martin Harper me!" and continues to ask the police officers to shoot him. "I want to die."   Patient reports during today's assessment that he consumed "Shrooms" and this is the reason he was behaving oddly. Patient very tearful and remorseful about using drugs. He is upset about not seeing his 44 month old son. States that he really wants to go home and misses his child.    Patient denies current SI. No history suicidal  attempts and gestures. No self mutilating behaviors. His biggest stressor is lack of employment and being away from his 74 month old child. Patient's denies a family history of mental illness. Patient denies HI. He does admit to a history of anxiety and episodes. He is currently seeking outpatient med management at [facility]. Patient prescribed medications 2-3 yrs ago after he was diagnosed with depression. Patient prescribed Bupropion and Lexapro. No AVH's and she does not appear to be responding to internal stimuli.   Patient with history of mushroom in the past month and believes this is the reason for a "bad trip". He has used mushrooms a total of 3 times in the past month. Last use was yesterday.  No alcohol use reported.  Total Time spent with patient: 45 minutes  Past Psychiatric History: depression, ADHD  Past Medical History:  Past Medical History:  Diagnosis Date  . Acid reflux   . Recurrent upper respiratory infection (URI)   . Urticaria     Past Surgical History:  Procedure Laterality Date  . gum graph  11/2017  . HAND SURGERY Left 2016  . TYMPANOSTOMY TUBE PLACEMENT     Family History:  Family History  Problem Relation Age of Onset  . Allergic rhinitis Mother   . Asthma Sister   . Eczema Neg Hx   . Urticaria Neg Hx   . Immunodeficiency Neg Hx   . Angioedema Neg Hx    Family Psychiatric  History: none Social History:  Social History  Substance and Sexual Activity  Alcohol Use No     Social History   Substance and Sexual Activity  Drug Use No    Social History   Socioeconomic History  . Marital status: Single    Spouse name: Not on file  . Number of children: Not on file  . Years of education: Not on file  . Highest education level: Not on file  Occupational History  . Not on file  Tobacco Use  . Smoking status: Never Smoker  . Smokeless tobacco: Never Used  Vaping Use  . Vaping Use: Never used  Substance and Sexual Activity  . Alcohol  use: No  . Drug use: No  . Sexual activity: Not on file  Other Topics Concern  . Not on file  Social History Narrative  . Not on file   Social Determinants of Health   Financial Resource Strain:   . Difficulty of Paying Living Expenses:   Food Insecurity:   . Worried About Charity fundraiser in the Last Year:   . Arboriculturist in the Last Year:   Transportation Needs:   . Film/video editor (Medical):   [name] Kitchen Lack of Transportation (Non-Medical):   Physical Activity:   . Days of Exercise per Week:   . Minutes of Exercise per Session:   Stress:   . Feeling of Stress :   Social Connections:   . Frequency of Communication with Friends and Family:   . Frequency of Social Gatherings with Friends and Family:   . Attends Religious Services:   . Active Member of Clubs or Organizations:   . Attends Archivist Meetings:   [name] Kitchen Marital Status:     Has this patient used any form of tobacco in the last 30 days? (Cigarettes, Smokeless Tobacco, Cigars, and/or Pipes) NA  Current Medications: Current Facility-Administered Medications  Medication Dose Route Frequency Provider Last Rate Last Admin  . risperiDONE (RISPERDAL M-TABS) disintegrating tablet 2 mg  2 mg Oral Q8H PRN Gareth Morgan, MD       And  . ziprasidone (GEODON) injection 20 mg  20 mg Intramuscular PRN Gareth Morgan, MD       Current Outpatient Medications  Medication Sig Dispense Refill  . anastrozole (ARIMIDEX) 1 MG tablet Take 1 mg by mouth daily.    [name] Kitchen BORON PO Take 1 tablet by mouth daily.    [name] Kitchen buPROPion (WELLBUTRIN XL) 300 MG 24 hr tablet TAKE 1 TABLET BY MOUTH EVERY DAY IN THE MORNING AS DIRECTED  1  . clomiPHENE (CLOMID) 50 MG tablet Take 50 mg by mouth daily.    [name] Kitchen escitalopram (LEXAPRO) 5 MG tablet Take 5 mg by mouth daily.     . magnesium 30 MG tablet Take 30 mg by mouth daily.    . Multiple Vitamin (MULTIVITAMIN WITH MINERALS) TABS tablet Take 1 tablet by mouth daily.    [name] Kitchen OVER THE COUNTER  MEDICATION Take 1 tablet by mouth daily. KRE-Alkalyn    . VYVANSE 40 MG capsule Take 40 mg by mouth at bedtime.    [name] Kitchen zinc gluconate 50 MG tablet Take 50 mg by mouth daily.    [name] Kitchen azelastine (ASTELIN) 0.1 % nasal spray Place 2 sprays into both nostrils 2 (two) times daily. Use in each nostril as directed (Patient not taking: Reported on 03/26/2020) 30 mL 5   PTA Medications: (Not in a hospital admission)   Musculoskeletal: Strength & Muscle Tone: within normal limits Gait & Station: normal  Patient leans: N/A  Psychiatric Specialty Exam: Physical Exam Vitals and nursing note reviewed.  Constitutional:      Appearance: Normal appearance.  HENT:     Head: Normocephalic.     Nose: Nose normal.  Pulmonary:     Effort: Pulmonary effort is normal.  Musculoskeletal:        General: Normal range of motion.     Cervical back: Normal range of motion.  Neurological:     General: No focal deficit present.     Mental Status: He is alert and oriented to person, place, and time.  Psychiatric:        Attention and Perception: Attention and perception normal.        Mood and Affect: Affect normal. Mood is anxious.        Speech: Speech normal.        Behavior: Behavior normal. Behavior is cooperative.        Thought Content: Thought content normal.        Cognition and Memory: Cognition and memory normal.        Judgment: Judgment normal.     Review of Systems  Psychiatric/Behavioral: The patient is nervous/anxious.   All other systems reviewed and are negative.   Blood pressure 124/63, pulse 78, temperature 97.8 F (36.6 C), temperature source Oral, resp. rate 20, SpO2 100 %.There is no height or weight on file to calculate BMI.  General Appearance: Casual  Eye Contact:  Good  Speech:  Normal Rate  Volume:  Normal  Mood:  Anxious  Affect:  Congruent  Thought Process:  Coherent and Descriptions of Associations: Intact  Orientation:  Full (Time, Place, and Person)  Thought Content:   WDL and Logical  Suicidal Thoughts:  No  Homicidal Thoughts:  No  Memory:  Immediate;   Good Recent;   Good Remote;   Good  Judgement:  Fair  Insight:  Fair  Psychomotor Activity:  Normal  Concentration:  Concentration: Good and Attention Span: Good  Recall:  Good  Fund of Knowledge:  Fair  Language:  Good  Akathisia:  No  Handed:  Right  AIMS (if indicated):     Assets:  Housing Leisure Time Physical Health Resilience Social Support  ADL's:  Intact  Cognition:  WNL  Sleep:        Demographic Factors:  Male and Adolescent or young adult  Loss Factors: NA  Historical Factors: NA  Risk Reduction Factors:   Responsible for children under 37 years of age, Sense of responsibility to family, Employed, Living with another person, especially a relative and Positive social support  Continued Clinical Symptoms:  Anxious, mild  Cognitive Features That Contribute To Risk:  None    Suicide Risk:  Minimal: No identifiable suicidal ideation.  Patients presenting with no risk factors but with morbid ruminations; may be classified as minimal risk based on the severity of the depressive symptoms   Plan Of Care/Follow-up recommendations:  Substance induced mood disorder: -ADS resources provided -Refrain from drug and alcohol use -Attend 12-step group with a sponsor Activity:  as tolerated Diet:  heart healthy diet  Disposition: discharge home Nanine Means, NP 03/27/2020, 11:04 AM

## 2020-03-27 NOTE — Discharge Instructions (Signed)
[facility] [address] [place], [place]  [address]

## 2020-03-27 NOTE — ED Provider Notes (Signed)
Emergency Medicine Observation Re-evaluation Note  Martin Bolton is a 24 y.o. male, seen on rounds today.  Pt initially presented to the ED for complaints of Psychiatric Evaluation Currently, the patient is alert, calm and comfortable.  He states "I have reprinted."  He states he will "no longer use Magic mushrooms.".  Physical Exam  BP 124/63 (BP Location: Left Arm)   Pulse 78   Temp 97.8 F (36.6 C) (Oral)   Resp 20   SpO2 100%  Physical Exam Alert, lucid.  Cooperative and interactive. ED Course / MDM  EKG:EKG Interpretation  Date/Time:  Friday March 26 2020 07:54:13 EDT Ventricular Rate:  83 PR Interval:    QRS Duration: 96 QT Interval:  354 QTC Calculation: 416 R Axis:   81 Text Interpretation: Sinus rhythm Probable inferior infarct, old No previous ECGs available Confirmed by Alvira Monday ([id number]) on 03/26/2020 9:14:16 AM    I have reviewed the labs performed to date as well as medications administered while in observation.  Recent changes in the last 24 hours include patient's mental status has improved, he is now appearing and acting normally.. Plan  Current plan is for reassessment by psychiatry. Patient is under full IVC at this time.   [name], MD 03/27/20 [phone]

## 2021-12-28 ENCOUNTER — Emergency Department (HOSPITAL_COMMUNITY): Payer: BLUE CROSS/BLUE SHIELD

## 2021-12-28 ENCOUNTER — Inpatient Hospital Stay (HOSPITAL_COMMUNITY)
Admission: EM | Admit: 2021-12-28 | Discharge: 2022-01-02 | DRG: 885 | Disposition: A | Discharge status: Other Acute Inpatient Hospital | Payer: BLUE CROSS/BLUE SHIELD | Attending: Internal Medicine | Admitting: Internal Medicine

## 2021-12-28 ENCOUNTER — Encounter (HOSPITAL_COMMUNITY): Payer: Self-pay

## 2021-12-28 DIAGNOSIS — J9600 Acute respiratory failure, unspecified whether with hypoxia or hypercapnia: Secondary | ICD-10-CM | POA: Diagnosis present

## 2021-12-28 DIAGNOSIS — F1994 Other psychoactive substance use, unspecified with psychoactive substance-induced mood disorder: Secondary | ICD-10-CM | POA: Diagnosis present

## 2021-12-28 DIAGNOSIS — Z888 Allergy status to other drugs, medicaments and biological substances status: Secondary | ICD-10-CM

## 2021-12-28 DIAGNOSIS — F2081 Schizophreniform disorder: Principal | ICD-10-CM

## 2021-12-28 DIAGNOSIS — Z818 Family history of other mental and behavioral disorders: Secondary | ICD-10-CM

## 2021-12-28 DIAGNOSIS — Z79899 Other long term (current) drug therapy: Secondary | ICD-10-CM

## 2021-12-28 DIAGNOSIS — J9602 Acute respiratory failure with hypercapnia: Secondary | ICD-10-CM | POA: Diagnosis not present

## 2021-12-28 DIAGNOSIS — F419 Anxiety disorder, unspecified: Secondary | ICD-10-CM | POA: Diagnosis present

## 2021-12-28 DIAGNOSIS — F209 Schizophrenia, unspecified: Secondary | ICD-10-CM | POA: Diagnosis not present

## 2021-12-28 DIAGNOSIS — F32A Depression, unspecified: Secondary | ICD-10-CM | POA: Diagnosis present

## 2021-12-28 DIAGNOSIS — F909 Attention-deficit hyperactivity disorder, unspecified type: Secondary | ICD-10-CM | POA: Diagnosis present

## 2021-12-28 DIAGNOSIS — Z825 Family history of asthma and other chronic lower respiratory diseases: Secondary | ICD-10-CM | POA: Diagnosis not present

## 2021-12-28 DIAGNOSIS — Y92410 Unspecified street and highway as the place of occurrence of the external cause: Secondary | ICD-10-CM | POA: Diagnosis not present

## 2021-12-28 DIAGNOSIS — J9601 Acute respiratory failure with hypoxia: Secondary | ICD-10-CM | POA: Diagnosis not present

## 2021-12-28 DIAGNOSIS — Y929 Unspecified place or not applicable: Secondary | ICD-10-CM

## 2021-12-28 DIAGNOSIS — T44904A Poisoning by unspecified drugs primarily affecting the autonomic nervous system, undetermined, initial encounter: Secondary | ICD-10-CM

## 2021-12-28 DIAGNOSIS — Z20822 Contact with and (suspected) exposure to covid-19: Secondary | ICD-10-CM | POA: Diagnosis present

## 2021-12-28 DIAGNOSIS — R410 Disorientation, unspecified: Principal | ICD-10-CM

## 2021-12-28 LAB — URINALYSIS, ROUTINE W REFLEX MICROSCOPIC
Bilirubin Urine: NEGATIVE
Glucose, UA: NEGATIVE mg/dL
Hgb urine dipstick: NEGATIVE
Ketones, ur: NEGATIVE mg/dL
Leukocytes,Ua: NEGATIVE
Nitrite: NEGATIVE
Protein, ur: NEGATIVE mg/dL
Specific Gravity, Urine: 1.027 (ref 1.005–1.030)
pH: 6 (ref 5.0–8.0)

## 2021-12-28 LAB — COMPREHENSIVE METABOLIC PANEL
ALT: 17 U/L (ref 0–44)
AST: 41 U/L (ref 15–41)
Albumin: 4.8 g/dL (ref 3.5–5.0)
Alkaline Phosphatase: 46 U/L (ref 38–126)
Anion gap: 14 (ref 5–15)
BUN: 7 mg/dL (ref 6–20)
CO2: 20 mmol/L — ABNORMAL LOW (ref 22–32)
Calcium: 9.2 mg/dL (ref 8.9–10.3)
Chloride: 102 mmol/L (ref 98–111)
Creatinine, Ser: 1.1 mg/dL (ref 0.61–1.24)
GFR, Estimated: >60 mL/min (ref >60)
Glucose, Bld: 110 mg/dL — ABNORMAL HIGH (ref 70–99)
Potassium: 4.4 mmol/L (ref 3.5–5.1)
Sodium: 136 mmol/L (ref 135–145)
Total Bilirubin: 1.3 mg/dL — ABNORMAL HIGH (ref 0.3–1.2)
Total Protein: 7.7 g/dL (ref 6.5–8.1)

## 2021-12-28 LAB — CBC
HCT: 54 % — ABNORMAL HIGH (ref 39.0–52.0)
HCT: 48.7 % (ref 39.0–52.0)
Hemoglobin: 18.1 g/dL — ABNORMAL HIGH (ref 13.0–17.0)
Hemoglobin: 17.1 g/dL — ABNORMAL HIGH (ref 13.0–17.0)
MCH: 30.5 pg (ref 26.0–34.0)
MCH: 31 pg (ref 26.0–34.0)
MCHC: 33.5 g/dL (ref 30.0–36.0)
MCHC: 35.1 g/dL (ref 30.0–36.0)
MCV: 91.1 fL (ref 80.0–100.0)
MCV: 88.4 fL (ref 80.0–100.0)
Platelets: 245 10*3/uL (ref 150–400)
Platelets: 242 10*3/uL (ref 150–400)
RBC: 5.93 MIL/uL — ABNORMAL HIGH (ref 4.22–5.81)
RBC: 5.51 MIL/uL (ref 4.22–5.81)
RDW: 14.5 % (ref 11.5–15.5)
RDW: 14.3 % (ref 11.5–15.5)
WBC: 11.9 10*3/uL — ABNORMAL HIGH (ref 4.0–10.5)
WBC: 13.9 10*3/uL — ABNORMAL HIGH (ref 4.0–10.5)
nRBC: 0 % (ref 0.0–0.2)
nRBC: 0 % (ref 0.0–0.2)

## 2021-12-28 LAB — I-STAT CHEM 8, ED
BUN: 8 mg/dL (ref 6–20)
Calcium, Ion: 0.95 mmol/L — ABNORMAL LOW (ref 1.15–1.40)
Chloride: 103 mmol/L (ref 98–111)
Creatinine, Ser: 1 mg/dL (ref 0.61–1.24)
Glucose, Bld: 107 mg/dL — ABNORMAL HIGH (ref 70–99)
HCT: 56 % — ABNORMAL HIGH (ref 39.0–52.0)
Hemoglobin: 19 g/dL — ABNORMAL HIGH (ref 13.0–17.0)
Potassium: 7.4 mmol/L (ref 3.5–5.1)
Sodium: 134 mmol/L — ABNORMAL LOW (ref 135–145)
TCO2: 26 mmol/L (ref 22–32)

## 2021-12-28 LAB — ACETAMINOPHEN LEVEL: Acetaminophen (Tylenol), Serum: <10 ug/mL — ABNORMAL LOW (ref 10–30)

## 2021-12-28 LAB — RAPID URINE DRUG SCREEN, HOSP PERFORMED
Amphetamines: NOT DETECTED
Barbiturates: NOT DETECTED
Benzodiazepines: NOT DETECTED
Cocaine: NOT DETECTED
Opiates: NOT DETECTED
Tetrahydrocannabinol: POSITIVE — AB

## 2021-12-28 LAB — BASIC METABOLIC PANEL
Anion gap: 14 (ref 5–15)
BUN: 6 mg/dL (ref 6–20)
CO2: 17 mmol/L — ABNORMAL LOW (ref 22–32)
Calcium: 8.8 mg/dL — ABNORMAL LOW (ref 8.9–10.3)
Chloride: 108 mmol/L (ref 98–111)
Creatinine, Ser: 1.15 mg/dL (ref 0.61–1.24)
GFR, Estimated: >60 mL/min (ref >60)
Glucose, Bld: 114 mg/dL — ABNORMAL HIGH (ref 70–99)
Potassium: 4 mmol/L (ref 3.5–5.1)
Sodium: 139 mmol/L (ref 135–145)

## 2021-12-28 LAB — PROTIME-INR
INR: 1 (ref 0.8–1.2)
Prothrombin Time: 13.3 seconds (ref 11.4–15.2)

## 2021-12-28 LAB — CREATININE, SERUM
Creatinine, Ser: 1.12 mg/dL (ref 0.61–1.24)
GFR, Estimated: >60 mL/min (ref >60)

## 2021-12-28 LAB — TROPONIN I (HIGH SENSITIVITY)
Troponin I (High Sensitivity): 4 ng/L (ref <18)
Troponin I (High Sensitivity): 4 ng/L (ref <18)

## 2021-12-28 LAB — SALICYLATE LEVEL: Salicylate Lvl: <7 mg/dL — ABNORMAL LOW (ref 7.0–30.0)

## 2021-12-28 LAB — I-STAT ARTERIAL BLOOD GAS, ED
Acid-Base Excess: 1 mmol/L (ref 0.0–2.0)
Bicarbonate: 27.1 mmol/L (ref 20.0–28.0)
Calcium, Ion: 1.14 mmol/L — ABNORMAL LOW (ref 1.15–1.40)
HCT: 51 % (ref 39.0–52.0)
Hemoglobin: 17.3 g/dL — ABNORMAL HIGH (ref 13.0–17.0)
O2 Saturation: 100 %
Patient temperature: 98.6
Potassium: 3.5 mmol/L (ref 3.5–5.1)
Sodium: 137 mmol/L (ref 135–145)
TCO2: 29 mmol/L (ref 22–32)
pCO2 arterial: 48.4 mmHg — ABNORMAL HIGH (ref 32–48)
pH, Arterial: 7.356 (ref 7.35–7.45)
pO2, Arterial: 558 mmHg — ABNORMAL HIGH (ref 83–108)

## 2021-12-28 LAB — SAMPLE TO BLOOD BANK

## 2021-12-28 LAB — RESP PANEL BY RT-PCR (FLU A&B, COVID) ARPGX2
Influenza A by PCR: NEGATIVE
Influenza B by PCR: NEGATIVE
SARS Coronavirus 2 by RT PCR: NEGATIVE

## 2021-12-28 LAB — HIV ANTIBODY (ROUTINE TESTING W REFLEX): HIV Screen 4th Generation wRfx: NONREACTIVE

## 2021-12-28 LAB — LACTIC ACID, PLASMA: Lactic Acid, Venous: 2.9 mmol/L (ref 0.5–1.9)

## 2021-12-28 LAB — MRSA NEXT GEN BY PCR, NASAL: MRSA by PCR Next Gen: NOT DETECTED

## 2021-12-28 LAB — ETHANOL: Alcohol, Ethyl (B): <10 mg/dL (ref <10)

## 2021-12-28 LAB — GLUCOSE, CAPILLARY: Glucose-Capillary: 114 mg/dL — ABNORMAL HIGH (ref 70–99)

## 2021-12-28 MED ORDER — IOHEXOL 300 MG/ML  SOLN
100.0000 mL | Freq: Once | INTRAMUSCULAR | Status: AC | PRN
  Administered 2021-12-28: 100 mL via INTRAVENOUS

## 2021-12-28 MED ORDER — ATROPINE SULFATE 1 MG/10ML IJ SOSY
PREFILLED_SYRINGE | INTRAMUSCULAR | Status: AC
  Filled 2021-12-28: qty 10

## 2021-12-28 MED ORDER — PROPOFOL 1000 MG/100ML IV EMUL
INTRAVENOUS | Status: AC
Start: 2021-12-28 — End: 2021-12-28
  Administered 2021-12-28: 20 ug/kg/min
  Filled 2021-12-28: qty 100

## 2021-12-28 MED ORDER — DEXMEDETOMIDINE HCL IN NACL 400 MCG/100ML IV SOLN
0.0000 ug/kg/h | INTRAVENOUS | Status: DC
  Administered 2021-12-28: 0.4 ug/kg/h via INTRAVENOUS
  Filled 2021-12-28: qty 100

## 2021-12-28 MED ORDER — MIDAZOLAM HCL 2 MG/2ML IJ SOLN
2.0000 mg | INTRAMUSCULAR | Status: DC | PRN
  Administered 2021-12-28: 2 mg via INTRAVENOUS
  Filled 2021-12-28: qty 2

## 2021-12-28 MED ORDER — FENTANYL BOLUS VIA INFUSION
50.0000 ug | INTRAVENOUS | Status: DC | PRN
  Filled 2021-12-28: qty 100

## 2021-12-28 MED ORDER — HALOPERIDOL 5 MG PO TABS
5.0000 mg | ORAL_TABLET | Freq: Two times a day (BID) | ORAL | Status: DC
  Administered 2021-12-28 – 2021-12-29 (×4): 5 mg via ORAL
  Filled 2021-12-28 (×6): qty 1

## 2021-12-28 MED ORDER — NICOTINE 21 MG/24HR TD PT24
21.0000 mg | MEDICATED_PATCH | Freq: Every day | TRANSDERMAL | Status: DC
  Administered 2021-12-28 – 2022-01-02 (×6): 21 mg via TRANSDERMAL
  Filled 2021-12-28 (×6): qty 1

## 2021-12-28 MED ORDER — PROPOFOL 1000 MG/100ML IV EMUL
INTRAVENOUS | Status: AC
  Filled 2021-12-28: qty 100

## 2021-12-28 MED ORDER — DOCUSATE SODIUM 50 MG/5ML PO LIQD: 100.0000 mg | Freq: Two times a day (BID) | ORAL | Status: DC | PRN

## 2021-12-28 MED ORDER — POLYETHYLENE GLYCOL 3350 17 G PO PACK: 17.0000 g | PACK | Freq: Every day | ORAL | Status: DC | PRN

## 2021-12-28 MED ORDER — ENOXAPARIN SODIUM 40 MG/0.4ML IJ SOSY
40.0000 mg | PREFILLED_SYRINGE | Freq: Every day | INTRAMUSCULAR | Status: DC
  Administered 2022-01-02: 40 mg via SUBCUTANEOUS
  Filled 2021-12-28 (×4): qty 0.4

## 2021-12-28 MED ORDER — SODIUM CHLORIDE 0.9 % IV BOLUS
1000.0000 mL | Freq: Once | INTRAVENOUS | Status: AC
  Administered 2021-12-28: 1000 mL via INTRAVENOUS

## 2021-12-28 MED ORDER — PANTOPRAZOLE SODIUM 40 MG IV SOLR: 40.0000 mg | Freq: Every day | INTRAVENOUS | Status: DC

## 2021-12-28 MED ORDER — ACETAMINOPHEN 325 MG PO TABS
650.0000 mg | ORAL_TABLET | ORAL | Status: DC | PRN
  Filled 2021-12-28: qty 2

## 2021-12-28 MED ORDER — FENTANYL 2500MCG IN NS 250ML (10MCG/ML) PREMIX INFUSION
INTRAVENOUS | Status: AC
  Administered 2021-12-28: 200 ug/h via INTRAVENOUS
  Filled 2021-12-28: qty 250

## 2021-12-28 MED ORDER — ETOMIDATE 2 MG/ML IV SOLN
INTRAVENOUS | Status: AC | PRN
  Administered 2021-12-28: 20 mg via INTRAVENOUS

## 2021-12-28 MED ORDER — FENTANYL CITRATE PF 50 MCG/ML IJ SOSY
50.0000 ug | PREFILLED_SYRINGE | Freq: Once | INTRAMUSCULAR | Status: DC
Start: 2021-12-28 — End: 2021-12-28

## 2021-12-28 MED ORDER — PHENYLEPHRINE 40 MCG/ML (10ML) SYRINGE FOR IV PUSH (FOR BLOOD PRESSURE SUPPORT): 80.0000 ug | PREFILLED_SYRINGE | Freq: Once | INTRAVENOUS | Status: DC | PRN

## 2021-12-28 MED ORDER — ONDANSETRON HCL 4 MG/2ML IJ SOLN: 4.0000 mg | Freq: Four times a day (QID) | INTRAMUSCULAR | Status: DC | PRN

## 2021-12-28 MED ORDER — CHLORHEXIDINE GLUCONATE CLOTH 2 % EX PADS
6.0000 | MEDICATED_PAD | Freq: Every day | CUTANEOUS | Status: DC
  Administered 2021-12-28 – 2021-12-29 (×2): 6 via TOPICAL

## 2021-12-28 MED ORDER — LACTATED RINGERS IV SOLN: INTRAVENOUS | Status: DC

## 2021-12-28 MED ORDER — PROPOFOL 1000 MG/100ML IV EMUL
5.0000 ug/kg/min | INTRAVENOUS | Status: DC
  Administered 2021-12-28: 40 ug/kg/min via INTRAVENOUS
  Filled 2021-12-28: qty 100

## 2021-12-28 MED ORDER — MIDAZOLAM HCL 2 MG/2ML IJ SOLN
INTRAMUSCULAR | Status: AC
  Administered 2021-12-28: 2 mg via INTRAVENOUS
  Filled 2021-12-28: qty 2

## 2021-12-28 MED ORDER — ROCURONIUM BROMIDE 50 MG/5ML IV SOLN
INTRAVENOUS | Status: DC | PRN
  Administered 2021-12-28: 100 mg via INTRAVENOUS

## 2021-12-28 MED ORDER — FENTANYL 2500MCG IN NS 250ML (10MCG/ML) PREMIX INFUSION: 50.0000 ug/h | INTRAVENOUS | Status: DC

## 2021-12-28 MED ORDER — FENTANYL 2500MCG IN NS 250ML (10MCG/ML) PREMIX INFUSION: 0.0000 ug/h | INTRAVENOUS | Status: DC

## 2021-12-28 MED ORDER — PHENYLEPHRINE 40 MCG/ML (10ML) SYRINGE FOR IV PUSH (FOR BLOOD PRESSURE SUPPORT)
PREFILLED_SYRINGE | INTRAVENOUS | Status: AC
Start: 2021-12-28 — End: 2021-12-28
  Filled 2021-12-28: qty 10

## 2021-12-28 MED ORDER — FENTANYL CITRATE (PF) 100 MCG/2ML IJ SOLN: 50.0000 ug | Freq: Once | INTRAMUSCULAR | Status: DC
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
## 2021-12-28 NOTE — Progress Notes (Signed)
Pt re-evaluated this morning and was noted to be hypotensive in the setting of sedation medications. He received phenylephrine IV push, becoming bradycardic, and requiring one dose of atropine. Bradycardia resolved. He was transitioned to precedex with goal to wean off sedation and extubate. He was extubated without difficulty. Sedation was discontinued.  ?-Psychiatry consulted ?-PT consult ? ?Mitzi Hansen, MD ?

## 2021-12-28 NOTE — Progress Notes (Signed)
Chaplain reported to Level 1 Code.  Pt in treatment; no family present.  Chaplain not needed at this time. ? ?De Burrs ?Chaplain ?

## 2021-12-28 NOTE — Progress Notes (Signed)
Seen in ICU ?Waking up, will work toward extubation. ?Thereafter will assess SI/HI and probably have psychiatry evaluate hi, given clinical history.   If does well after extubation, probably medically cleared tomorrow. ? ?Additional 31 min cc time ? ?Myrla Halsted MD PCCM ?

## 2021-12-28 NOTE — Significant Event (Signed)
CCM phone call ? ?Called mother Jontavious Commons at [phone]. Mother was unaware patient was in hospital. Briefly updated on condition. Calling to get consent for CVC placment. After explantation from myself and Dr. Ruben Im the mother has refused CVC placement due to concerns regarding risks of procedure.  ? ?Gershon Mussel., MSN, APRN, AGACNP-BC ?[facility]  ?12/28/2021 , 8:48 AM ? ?Please see [email or web address] for pager details ? ?If no response, please call [phone] ?After hours, please call Elink at [phone] ? ?

## 2021-12-28 NOTE — Procedures (Signed)
Extubation Procedure Note ? ?Patient Details:   ?Name: Martin Bolton ?DOB: 02-12-1996 ?MRN: [id number] ?  ?Airway Documentation:  ?  ?Vent end date: 12/28/21 Vent end time: 0910  ? ?Evaluation ? O2 sats: stable throughout ?Complications: No apparent complications ?Patient did tolerate procedure well. ?Bilateral Breath Sounds: Clear, Diminished ?  ?Patient able to speak & cough post extubation. Placed on 4L [place] ? ?Jacqulynn Cadet ?12/28/2021, 9:13 AM ? ?

## 2021-12-28 NOTE — Progress Notes (Signed)
IVC paperwork faxed to magistrate, custody order received.  Call made to [facility] who will send [employer] for service.  Paperwork given to Licensed conveyancer who will place on chart after service is completed. ?Daleen Squibb, MSW, LCSW ?3/29/20235:15 PM  ?
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
## 2021-12-28 NOTE — Progress Notes (Signed)
eLink Physician-Brief Progress Note ?Patient Name: Martin Bolton ?DOB: Sep 27, 1996 ?MRN: [id number] ? ? ?Date of Service ? 12/28/2021  ?HPI/Events of Note ? Discussed with bedside RN that as long as there are guards looking into the room, there is no need for another sitter.   ?eICU Interventions ? There are 2 guards outside looking into the room as per RN.  No need for sitter at this time.  ? ? ? ?Intervention Category ?Minor Interventions: Other: ? ?Elsie Lincoln ?12/28/2021, 9:31 PM ?
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
## 2021-12-28 NOTE — ED Notes (Signed)
Pt upgrade to level 1 due to airway management and pt being belligerent  ?

## 2021-12-28 NOTE — Progress Notes (Signed)
aVL lead showing ST elevation.  Informed Dr. Katrinka Blazing.  Troponin ordered  Will continue plan of care.  Erick Blinks, RN  ?

## 2021-12-28 NOTE — ED Notes (Signed)
Pt pale cool and diaphoretic  ?

## 2021-12-28 NOTE — ED Notes (Signed)
Pt with flight of ideas. Pt yelling that he is a vibrator, to get the vibrator off his dick, and he is from [place] and he [place].   ?

## 2021-12-28 NOTE — Progress Notes (Signed)
Orthopedic Tech Progress Note ?Patient Details:  ?Martin Bolton ?02-07-1996 ?[id number] ? ?Patient ID: Martin Bolton, male   DOB: 03-Sep-1996, 26 y.o.   MRN: [id number] ?Level 1 trauma not needed.  ?Martin Bolton ?12/28/2021, 3:07 AM ? ?

## 2021-12-29 ENCOUNTER — Encounter (HOSPITAL_COMMUNITY): Payer: Self-pay | Admitting: Pulmonary Disease

## 2021-12-29 ENCOUNTER — Other Ambulatory Visit: Payer: Self-pay

## 2021-12-29 DIAGNOSIS — J9602 Acute respiratory failure with hypercapnia: Secondary | ICD-10-CM | POA: Diagnosis not present

## 2021-12-29 DIAGNOSIS — J9601 Acute respiratory failure with hypoxia: Secondary | ICD-10-CM | POA: Diagnosis not present

## 2021-12-29 LAB — LIPID PANEL
Cholesterol: 166 mg/dL (ref 0–200)
HDL: 43 mg/dL (ref >40)
LDL Cholesterol: 106 mg/dL — ABNORMAL HIGH (ref 0–99)
Total CHOL/HDL Ratio: 3.9 RATIO
Triglycerides: 86 mg/dL (ref <150)
VLDL: 17 mg/dL (ref 0–40)

## 2021-12-29 LAB — HEMOGLOBIN A1C
Hgb A1c MFr Bld: 4.9 % (ref 4.8–5.6)
Mean Plasma Glucose: 93.93 mg/dL

## 2021-12-29 LAB — CBC
HCT: 46.9 % (ref 39.0–52.0)
Hemoglobin: 16.2 g/dL (ref 13.0–17.0)
MCH: 31 pg (ref 26.0–34.0)
MCHC: 34.5 g/dL (ref 30.0–36.0)
MCV: 89.7 fL (ref 80.0–100.0)
Platelets: 239 10*3/uL (ref 150–400)
RBC: 5.23 MIL/uL (ref 4.22–5.81)
RDW: 14.5 % (ref 11.5–15.5)
WBC: 13.3 10*3/uL — ABNORMAL HIGH (ref 4.0–10.5)
nRBC: 0 % (ref 0.0–0.2)

## 2021-12-29 LAB — BASIC METABOLIC PANEL
Anion gap: 7 (ref 5–15)
BUN: 8 mg/dL (ref 6–20)
CO2: 26 mmol/L (ref 22–32)
Calcium: 8.6 mg/dL — ABNORMAL LOW (ref 8.9–10.3)
Chloride: 105 mmol/L (ref 98–111)
Creatinine, Ser: 1.05 mg/dL (ref 0.61–1.24)
GFR, Estimated: >60 mL/min (ref >60)
Glucose, Bld: 114 mg/dL — ABNORMAL HIGH (ref 70–99)
Potassium: 3.7 mmol/L (ref 3.5–5.1)
Sodium: 138 mmol/L (ref 135–145)

## 2021-12-29 LAB — PHOSPHORUS: Phosphorus: 2.8 mg/dL (ref 2.5–4.6)

## 2021-12-29 LAB — MAGNESIUM: Magnesium: 2 mg/dL (ref 1.7–2.4)

## 2021-12-29 MED ORDER — LACTATED RINGERS IV BOLUS
1000.0000 mL | Freq: Once | INTRAVENOUS | Status: AC
  Administered 2021-12-29: 1000 mL via INTRAVENOUS
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
## 2021-12-29 NOTE — Plan of Care (Signed)
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
## 2021-12-29 NOTE — ED Notes (Signed)
Verbal order to add bilateral ankle soft restraints due to pt kicking  ?

## 2021-12-29 NOTE — TOC CAGE-AID Note (Signed)
Transition of Care (TOC) - CAGE-AID Screening ? ? ?Patient Details  ?Name: Martin Bolton ?MRN: [id number] ?Date of Birth: 01-05-1996 ? ?Transition of Care (TOC) CM/SW Contact:    ?Kao Berkheimer C Tarpley-Carter, LCSWA ?Phone Number: ?12/29/2021, 12:07 PM ? ? ?Clinical Narrative: ?Pt refused assessment.  CSW will provide pt with resources. ? ?Insurance underwriter, MSW, LCSW-A ?Pronouns:  She/Her/Hers ?[facility] ?Clinical Social Worker ?Direct Number:  [phone] ?[email or web address] ? ?CAGE-AID Screening: ?Substance Abuse Screening unable to be completed due to: : Patient Refused ? ?  ?  ?  ?  ?  ? ?Substance Abuse Education Offered: Yes ? ?Substance abuse interventions: Educational Materials ? ? ? ? ? ? ?

## 2021-12-30 DIAGNOSIS — J9602 Acute respiratory failure with hypercapnia: Secondary | ICD-10-CM | POA: Diagnosis not present

## 2021-12-30 DIAGNOSIS — F2081 Schizophreniform disorder: Secondary | ICD-10-CM

## 2021-12-30 DIAGNOSIS — F209 Schizophrenia, unspecified: Secondary | ICD-10-CM

## 2021-12-30 DIAGNOSIS — J9601 Acute respiratory failure with hypoxia: Secondary | ICD-10-CM | POA: Diagnosis not present

## 2021-12-30 LAB — POCT I-STAT, CHEM 8
BUN: 8 mg/dL (ref 6–20)
Calcium, Ion: 0.95 mmol/L — ABNORMAL LOW (ref 1.15–1.40)
Chloride: 103 mmol/L (ref 98–111)
Creatinine, Ser: 1 mg/dL (ref 0.61–1.24)
Glucose, Bld: 107 mg/dL — ABNORMAL HIGH (ref 70–99)
HCT: 56 % — ABNORMAL HIGH (ref 39.0–52.0)
Hemoglobin: 19 g/dL — ABNORMAL HIGH (ref 13.0–17.0)
Potassium: 7.4 mmol/L (ref 3.5–5.1)
Sodium: 134 mmol/L — ABNORMAL LOW (ref 135–145)
TCO2: 26 mmol/L (ref 22–32)

## 2021-12-30 MED ORDER — HALOPERIDOL LACTATE 5 MG/ML IJ SOLN
5.0000 mg | Freq: Two times a day (BID) | INTRAMUSCULAR | Status: DC
  Filled 2021-12-30: qty 1

## 2021-12-30 MED ORDER — RISPERIDONE 1 MG PO TBDP
2.0000 mg | ORAL_TABLET | Freq: Two times a day (BID) | ORAL | Status: DC
  Administered 2021-12-30 – 2022-01-02 (×5): 2 mg via ORAL
  Filled 2021-12-30 (×7): qty 2

## 2021-12-30 MED ORDER — LORAZEPAM 2 MG/ML IJ SOLN
1.0000 mg | Freq: Once | INTRAMUSCULAR | Status: AC
  Administered 2021-12-30: 1 mg via INTRAVENOUS
  Filled 2021-12-30: qty 1

## 2021-12-30 MED ORDER — CYCLOBENZAPRINE HCL 10 MG PO TABS
10.0000 mg | ORAL_TABLET | Freq: Three times a day (TID) | ORAL | Status: DC | PRN
  Administered 2021-12-30 – 2022-01-01 (×4): 10 mg via ORAL
  Filled 2021-12-30 (×5): qty 1
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
## 2021-12-30 NOTE — Progress Notes (Signed)
Flexeril ordered for neck spasms and it seems to be helping. Pt states his anxiety level is at an "8'. Dr. Onalee Hua notified and Ativan 1 mg ordered IV, given via NSL.  ?

## 2021-12-30 NOTE — TOC Progression Note (Signed)
Transition of Care (TOC) - Progression Note  ? ? ?Patient Details  ?Name: Martin Bolton ?MRN: [id number] ?Date of Birth: 06-Jul-1996 ? ?Transition of Care (TOC) CM/SW Contact  ?Lorri Frederick, LCSW ?Phone Number: ?12/30/2021, 4:00 PM ? ?Clinical Narrative:    ?Pt faxed out to the following inpt psych facilities: ?[facility] ?[facility] ?[facility]--they responded and pt is under review there.  They have a different name now, something [place] ?[facility]--fax did not go through ?[facility] ?[facility] ?[facility]-they responded, pt is under review there, please call back over the weekend. ?[facility] ? ? ? ?Expected Discharge Plan: Psychiatric Hospital ?Barriers to Discharge: Other (must enter comment) (pending inpt psych bed) ? ?Expected Discharge Plan and Services ?Expected Discharge Plan: Psychiatric Hospital ?In-house Referral: Clinical Social Work ?  ?Post Acute Care Choice: NA ?Living arrangements for the past 2 months: Single Family Home ?                ?  ?  ?  ?  ?  ?  ?  ?  ?  ?  ? ? ?Social Determinants of Health (SDOH) Interventions ?  ? ?Readmission Risk Interventions ?   ? View : No data to display.  ?  ?  ?  ? ? ?

## 2021-12-30 NOTE — Progress Notes (Signed)
Pt still resting. 2 Police attending. ?
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
## 2021-12-30 NOTE — Progress Notes (Signed)
Pt more relaxed after ativan given, resting. ?

## 2021-12-30 NOTE — Plan of Care (Signed)
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
## 2021-12-31 DIAGNOSIS — F2081 Schizophreniform disorder: Secondary | ICD-10-CM | POA: Diagnosis not present

## 2021-12-31 DIAGNOSIS — F209 Schizophrenia, unspecified: Secondary | ICD-10-CM

## 2021-12-31 MED ORDER — LORAZEPAM 1 MG PO TABS
1.0000 mg | ORAL_TABLET | Freq: Three times a day (TID) | ORAL | Status: DC | PRN
  Administered 2021-12-31 – 2022-01-01 (×2): 1 mg via ORAL
  Filled 2021-12-31 (×2): qty 1
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
## 2021-12-31 NOTE — Progress Notes (Signed)
Chaplain responded to page from pt's nurse for spiritual support of pt who is exhibiting signs of sadness.  Chaplain initiated ministry of care and presence with pt who spoke of his sadness over not being able to see his wife and child and his grandmother who is dying.  Pt expressed concern over not knowing how long he will be in the hospital.  Pt acknowledged he will be going to jail upon discharge and wanting to put that part of his life behind him.  When asked how Chaplain could comfort him, pt requested prayer.  Chaplain prayed at bedside offering up pt's specific requests and concerns. ? ?[facility] ?Chaplain ?

## 2021-12-31 NOTE — Progress Notes (Signed)
Prn ativan ordered for anxiety by psychiatry ?
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
## 2021-12-31 NOTE — Progress Notes (Signed)
Pt asking to see the Chaplin. Appears upset and says that he wants to see his kid. Chaplin paged ?

## 2021-12-31 NOTE — Progress Notes (Signed)
Pt is asking for ativan for anxiety.  ?

## 2022-01-01 DIAGNOSIS — F2081 Schizophreniform disorder: Secondary | ICD-10-CM | POA: Diagnosis not present
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
## 2022-01-02 ENCOUNTER — Other Ambulatory Visit (HOSPITAL_COMMUNITY): Payer: Self-pay

## 2022-01-02 DIAGNOSIS — F2081 Schizophreniform disorder: Secondary | ICD-10-CM | POA: Diagnosis not present

## 2022-01-02 MED ORDER — RISPERIDONE 2 MG PO TABS
2.0000 mg | ORAL_TABLET | Freq: Two times a day (BID) | ORAL | 0 refills | Status: AC
Start: 2022-01-02 — End: 2022-02-01
  Filled 2022-01-02: qty 60, 30d supply, fill #0

## 2022-01-02 NOTE — Progress Notes (Signed)
Pt discharged with law enforcement officers to [facility] jail this evening. Attempted to call report to receiving facility multiple times with no response received. Discharge packet given to law enforcement with unit phone number provided should there be any questions ?
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
## 2022-01-02 NOTE — TOC Progression Note (Signed)
Transition of Care (TOC) - Progression Note  ? ? ?Patient Details  ?Name: Martin Bolton ?MRN: [id number] ?Date of Birth: 1996-01-09 ? ?Transition of Care (TOC) CM/SW Contact  ?Coralee Pesa, LCSWA ?Phone Number: ?01/02/2022, 12:35 PM ? ?Clinical Narrative:    ?CSW was notified that patient is discharging and his IVC can be removed. Change of commitment paperwork completed, Signed by Dr. Lovette Cliche, and faxed to the magistrate.  ? ? ?Expected Discharge Plan: [facility] ?Barriers to Discharge: Other (must enter comment) (pending inpt psych bed) ? ?Expected Discharge Plan and Services ?Expected Discharge Plan: [facility] ?In-house Referral: Clinical Social Work ?  ?Post Acute Care Choice: NA ?Living arrangements for the past 2 months: [place] ?                ?  ?  ?  ?  ?  ?  ?  ?  ?  ?  ? ? ?Social Determinants of Health (SDOH) Interventions ?  ? ?Readmission Risk Interventions ?   ? View : No data to display.  ?  ?  ?  ? ? ?

## 2022-01-02 NOTE — TOC Progression Note (Addendum)
Transition of Care (TOC) - Progression Note  ? ? ?Patient Details  ?Name: Martin Bolton ?MRN: [id number] ?Date of Birth: 1996/07/31 ? ?Transition of Care (TOC) CM/SW Contact  ?Marilu Favre, RN ?Phone Number: ?01/02/2022, 12:06 PM ? ?Clinical Narrative:    ? ?Was told by attending patient would be discharging to correctional center today. NCM discussed with officers in hallway. NCM called Intake Nurse at [facility]. Intake nurse answered and said she will call NCM back. Intake has NCM direct number await call back.  ? ?1254 called intake back , no answer no voicemail   ? ?1352 called intake nurse again no answer no voicemail  ? ?[facility] spoke to [place] , was instructed to fax discharge summary to them at [phone] same done and for nurse to call report to please call report to jail at [phone] if no answer call [phone] if no answer call [phone] , numbers given to nurse  ? ?Expected Discharge Plan: [facility] ?Barriers to Discharge: Other (must enter comment) (pending inpt psych bed) ? ?Expected Discharge Plan and Services ?Expected Discharge Plan: [facility] ?In-house Referral: Clinical Social Work ?  ?Post Acute Care Choice: NA ?Living arrangements for the past 2 months: [place] ?                ?  ?  ?  ?  ?  ?  ?  ?  ?  ?  ? ? ?Social Determinants of Health (SDOH) Interventions ?  ? ?Readmission Risk Interventions ?   ? View : No data to display.  ?  ?  ?  ? ? ?
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
## 2022-01-13 LAB — MISC LABCORP TEST (SEND OUT): Labcorp test code: 791006

## 2022-01-31 MED FILL — Medication: Qty: 1 | Status: AC
